# Patient Record
Sex: Female | Born: 1970 | Race: White | Hispanic: No | Marital: Married | Smoking: Former smoker
Health system: Southern US, Community
[De-identification: ages and names within clinical notes are randomized; demographics above are authoritative.]

## PROBLEM LIST (undated history)

## (undated) DIAGNOSIS — E669 Obesity, unspecified: Secondary | ICD-10-CM

## (undated) DIAGNOSIS — F32A Depression, unspecified: Secondary | ICD-10-CM

## (undated) DIAGNOSIS — I10 Essential (primary) hypertension: Secondary | ICD-10-CM

## (undated) DIAGNOSIS — F411 Generalized anxiety disorder: Secondary | ICD-10-CM

## (undated) DIAGNOSIS — J45909 Unspecified asthma, uncomplicated: Secondary | ICD-10-CM

## (undated) DIAGNOSIS — F329 Major depressive disorder, single episode, unspecified: Secondary | ICD-10-CM

## (undated) HISTORY — DX: Generalized anxiety disorder: F41.1

## (undated) HISTORY — PX: HEMORRHOID SURGERY: SHX153

## (undated) HISTORY — DX: Essential (primary) hypertension: I10

## (undated) HISTORY — DX: Obesity, unspecified: E66.9

## (undated) HISTORY — DX: Depression, unspecified: F32.A

## (undated) HISTORY — DX: Major depressive disorder, single episode, unspecified: F32.9

## (undated) HISTORY — DX: Unspecified asthma, uncomplicated: J45.909

---

## 2017-09-19 DIAGNOSIS — N390 Urinary tract infection, site not specified: Secondary | ICD-10-CM | POA: Insufficient documentation

## 2017-09-19 DIAGNOSIS — F32A Depression, unspecified: Secondary | ICD-10-CM | POA: Insufficient documentation

## 2017-09-19 DIAGNOSIS — J45909 Unspecified asthma, uncomplicated: Secondary | ICD-10-CM | POA: Insufficient documentation

## 2018-02-14 DIAGNOSIS — Z720 Tobacco use: Secondary | ICD-10-CM | POA: Insufficient documentation

## 2018-03-05 ENCOUNTER — Other Ambulatory Visit: Payer: Self-pay | Admitting: Internal Medicine

## 2018-03-05 DIAGNOSIS — Z1231 Encounter for screening mammogram for malignant neoplasm of breast: Secondary | ICD-10-CM

## 2018-03-28 ENCOUNTER — Ambulatory Visit: Payer: Self-pay

## 2018-04-25 ENCOUNTER — Ambulatory Visit
Admission: RE | Admit: 2018-04-25 | Discharge: 2018-04-25 | Disposition: A | Discharge status: Home / Self Care | Payer: Managed Care, Other (non HMO) | Source: Ambulatory Visit | Attending: Internal Medicine | Admitting: Internal Medicine

## 2018-04-25 ENCOUNTER — Other Ambulatory Visit: Payer: Self-pay | Admitting: Nurse Practitioner

## 2018-04-25 ENCOUNTER — Ambulatory Visit (HOSPITAL_COMMUNITY)
Admission: RE | Admit: 2018-04-25 | Discharge: 2018-04-25 | Disposition: A | Discharge status: Home / Self Care | Payer: Managed Care, Other (non HMO) | Source: Ambulatory Visit | Attending: Nurse Practitioner | Admitting: Nurse Practitioner

## 2018-04-25 DIAGNOSIS — R05 Cough: Secondary | ICD-10-CM | POA: Diagnosis not present

## 2018-04-25 DIAGNOSIS — R059 Cough, unspecified: Secondary | ICD-10-CM

## 2018-04-25 DIAGNOSIS — Z1231 Encounter for screening mammogram for malignant neoplasm of breast: Secondary | ICD-10-CM

## 2018-08-01 DIAGNOSIS — R000 Tachycardia, unspecified: Secondary | ICD-10-CM | POA: Insufficient documentation

## 2018-08-07 ENCOUNTER — Other Ambulatory Visit: Payer: Self-pay | Admitting: Adult Health

## 2018-08-07 MED ORDER — PROMETHAZINE-DM 6.25-15 MG/5ML PO SYRP: 5.0000 mL | ORAL_SOLUTION | Freq: Four times a day (QID) | ORAL | 1 refills | Status: DC | PRN

## 2018-08-07 NOTE — Progress Notes (Signed)
Patient is coughing at work, + nasal congestion, ongoing for 1+ week. No fever/chills, sore throat, dyspnea/wheezing, headache, rash or other concerning symptoms. Will send in promethazine-DM, follow up with PCP if not improving.

## 2018-09-03 ENCOUNTER — Other Ambulatory Visit: Payer: Self-pay | Admitting: Adult Health

## 2018-09-03 MED ORDER — MELOXICAM 15 MG PO TABS: ORAL_TABLET | ORAL | 1 refills | Status: DC

## 2018-09-25 ENCOUNTER — Other Ambulatory Visit: Payer: Self-pay | Admitting: Internal Medicine

## 2018-09-25 DIAGNOSIS — H109 Unspecified conjunctivitis: Secondary | ICD-10-CM

## 2018-09-25 MED ORDER — NEOMYCIN-POLYMYXIN-DEXAMETH 3.5-10000-0.1 OP SUSP: OPHTHALMIC | 1 refills | Status: DC

## 2018-12-18 ENCOUNTER — Other Ambulatory Visit: Payer: Self-pay | Admitting: Adult Health

## 2018-12-18 MED ORDER — OSELTAMIVIR PHOSPHATE 75 MG PO CAPS: 75.0000 mg | ORAL_CAPSULE | Freq: Two times a day (BID) | ORAL | 0 refills | Status: AC

## 2018-12-18 MED ORDER — PREDNISONE 20 MG PO TABS: ORAL_TABLET | ORAL | 0 refills | Status: DC

## 2019-02-24 ENCOUNTER — Other Ambulatory Visit: Payer: Self-pay | Admitting: Adult Health Nurse Practitioner

## 2019-02-24 DIAGNOSIS — M62838 Other muscle spasm: Secondary | ICD-10-CM

## 2019-02-24 MED ORDER — BACLOFEN 10 MG PO TABS: 10.0000 mg | ORAL_TABLET | Freq: Two times a day (BID) | ORAL | 1 refills | Status: DC

## 2019-03-18 ENCOUNTER — Other Ambulatory Visit: Payer: Self-pay | Admitting: Adult Health Nurse Practitioner

## 2019-03-18 DIAGNOSIS — M62838 Other muscle spasm: Secondary | ICD-10-CM

## 2019-04-27 ENCOUNTER — Other Ambulatory Visit: Payer: Self-pay | Admitting: Internal Medicine

## 2019-04-27 ENCOUNTER — Other Ambulatory Visit: Payer: Self-pay

## 2019-04-27 ENCOUNTER — Other Ambulatory Visit: Payer: Managed Care, Other (non HMO)

## 2019-04-27 DIAGNOSIS — Z20822 Contact with and (suspected) exposure to covid-19: Secondary | ICD-10-CM

## 2019-04-27 DIAGNOSIS — Z20828 Contact with and (suspected) exposure to other viral communicable diseases: Secondary | ICD-10-CM

## 2019-04-28 ENCOUNTER — Other Ambulatory Visit: Payer: Self-pay | Admitting: Adult Health

## 2019-04-28 DIAGNOSIS — H60502 Unspecified acute noninfective otitis externa, left ear: Secondary | ICD-10-CM

## 2019-04-28 MED ORDER — CIPROFLOXACIN-DEXAMETHASONE 0.3-0.1 % OT SUSP: 4.0000 [drp] | Freq: Two times a day (BID) | OTIC | 0 refills | Status: DC

## 2019-04-28 NOTE — Progress Notes (Signed)
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 No palpable lymph nodes on exam, canal appears erythematous, with serous discharge, very tender, non-obstructive. Tympanic membrane appears normal without erythema, retractions, bulging. Ciprodex sent in to pharmacy on file.

## 2019-04-29 ENCOUNTER — Other Ambulatory Visit: Payer: Self-pay | Admitting: Adult Health

## 2019-04-29 LAB — SARS-COV-2 RNA,(COVID-19) QUALITATIVE NAAT: SARS CoV2 RNA: NOT DETECTED

## 2019-04-29 MED ORDER — HYDROCORTISONE-ACETIC ACID 1-2 % OT SOLN: 3.0000 [drp] | Freq: Two times a day (BID) | OTIC | 1 refills | Status: DC

## 2019-06-09 ENCOUNTER — Other Ambulatory Visit: Payer: Self-pay | Admitting: Nurse Practitioner

## 2019-06-09 DIAGNOSIS — Z1231 Encounter for screening mammogram for malignant neoplasm of breast: Secondary | ICD-10-CM

## 2019-06-17 ENCOUNTER — Other Ambulatory Visit: Payer: Self-pay | Admitting: Physician Assistant

## 2019-06-17 MED ORDER — MUPIROCIN CALCIUM 2 % EX CREA: 1.0000 "application " | TOPICAL_CREAM | Freq: Three times a day (TID) | CUTANEOUS | 2 refills | Status: DC

## 2019-06-18 ENCOUNTER — Other Ambulatory Visit: Payer: Self-pay | Admitting: Physician Assistant

## 2019-06-18 MED ORDER — MUPIROCIN 2 % EX OINT: 1.0000 "application " | TOPICAL_OINTMENT | Freq: Two times a day (BID) | CUTANEOUS | 0 refills | Status: DC

## 2019-07-24 ENCOUNTER — Ambulatory Visit
Admission: RE | Admit: 2019-07-24 | Discharge: 2019-07-24 | Disposition: A | Discharge status: Home / Self Care | Payer: Managed Care, Other (non HMO) | Source: Ambulatory Visit | Attending: Nurse Practitioner | Admitting: Nurse Practitioner

## 2019-07-24 ENCOUNTER — Other Ambulatory Visit: Payer: Self-pay

## 2019-07-24 DIAGNOSIS — Z1231 Encounter for screening mammogram for malignant neoplasm of breast: Secondary | ICD-10-CM

## 2019-10-29 ENCOUNTER — Other Ambulatory Visit: Payer: Self-pay | Admitting: Nurse Practitioner

## 2019-10-29 DIAGNOSIS — N63 Unspecified lump in unspecified breast: Secondary | ICD-10-CM

## 2019-11-17 ENCOUNTER — Ambulatory Visit
Admission: RE | Admit: 2019-11-17 | Discharge: 2019-11-17 | Disposition: A | Discharge status: Home / Self Care | Payer: Managed Care, Other (non HMO) | Source: Ambulatory Visit | Attending: Nurse Practitioner | Admitting: Nurse Practitioner

## 2019-11-17 ENCOUNTER — Other Ambulatory Visit: Payer: Self-pay

## 2019-11-17 DIAGNOSIS — N63 Unspecified lump in unspecified breast: Secondary | ICD-10-CM

## 2019-11-30 ENCOUNTER — Ambulatory Visit: Payer: Managed Care, Other (non HMO) | Attending: Internal Medicine

## 2019-11-30 DIAGNOSIS — Z20822 Contact with and (suspected) exposure to covid-19: Secondary | ICD-10-CM

## 2019-12-01 LAB — NOVEL CORONAVIRUS, NAA: SARS-CoV-2, NAA: NOT DETECTED

## 2019-12-08 ENCOUNTER — Other Ambulatory Visit: Payer: Self-pay | Admitting: Adult Health Nurse Practitioner

## 2019-12-08 ENCOUNTER — Other Ambulatory Visit: Payer: Managed Care, Other (non HMO)

## 2019-12-08 ENCOUNTER — Encounter: Payer: Self-pay | Admitting: Adult Health Nurse Practitioner

## 2019-12-08 ENCOUNTER — Other Ambulatory Visit: Payer: Self-pay

## 2019-12-08 DIAGNOSIS — M79661 Pain in right lower leg: Secondary | ICD-10-CM

## 2019-12-08 NOTE — Progress Notes (Signed)
Pain to right calf, constant, increased with walking, tender to palpation.  No erythema, or edema to leg upper or lower. No shortness of breath or chest pain. - Wells screening (1) no DVT history or surgeries.  Check D-dimer, follow up Ultrasound if positive.  Elder Negus, NP [facility] 12/08/2019  2:04 PM

## 2019-12-09 LAB — D-DIMER, QUANTITATIVE: D-Dimer, Quant: 0.28 mcg/mL FEU (ref <0.50)

## 2020-03-18 ENCOUNTER — Other Ambulatory Visit: Payer: Self-pay | Admitting: Adult Health

## 2020-03-19 ENCOUNTER — Other Ambulatory Visit: Payer: Self-pay | Admitting: Adult Health

## 2020-03-19 DIAGNOSIS — U071 COVID-19: Secondary | ICD-10-CM

## 2020-03-19 MED ORDER — DEXAMETHASONE 1 MG PO TABS: ORAL_TABLET | ORAL | 0 refills | Status: DC

## 2020-04-04 ENCOUNTER — Other Ambulatory Visit: Payer: Self-pay | Admitting: Adult Health Nurse Practitioner

## 2020-04-04 DIAGNOSIS — B370 Candidal stomatitis: Secondary | ICD-10-CM

## 2020-04-04 MED ORDER — FLUCONAZOLE 150 MG PO TABS: ORAL_TABLET | ORAL | 0 refills | Status: DC

## 2020-04-15 ENCOUNTER — Other Ambulatory Visit: Payer: Self-pay | Admitting: Adult Health

## 2020-04-15 MED ORDER — ACYCLOVIR 400 MG PO TABS: 400.0000 mg | ORAL_TABLET | Freq: Three times a day (TID) | ORAL | 0 refills | Status: DC

## 2020-05-09 ENCOUNTER — Other Ambulatory Visit: Payer: Self-pay | Admitting: Adult Health Nurse Practitioner

## 2020-05-09 DIAGNOSIS — B001 Herpesviral vesicular dermatitis: Secondary | ICD-10-CM

## 2020-05-09 MED ORDER — ACYCLOVIR 400 MG PO TABS: 400.0000 mg | ORAL_TABLET | Freq: Three times a day (TID) | ORAL | 2 refills | Status: AC

## 2020-06-06 ENCOUNTER — Telehealth (INDEPENDENT_AMBULATORY_CARE_PROVIDER_SITE_OTHER): Payer: Managed Care, Other (non HMO) | Admitting: Physician Assistant

## 2020-06-06 DIAGNOSIS — Z6835 Body mass index (BMI) 35.0-35.9, adult: Secondary | ICD-10-CM

## 2020-06-06 MED ORDER — WEGOVY 0.25 MG/0.5ML ~~LOC~~ SOAJ: 0.2500 mg | SUBCUTANEOUS | 0 refills | Status: DC

## 2020-06-06 MED ORDER — WEGOVY 0.5 MG/0.5ML ~~LOC~~ SOAJ: 0.5000 mg | SUBCUTANEOUS | 0 refills | Status: DC

## 2020-06-06 NOTE — Telephone Encounter (Signed)
Patient's BMI is 35.5 with weight os 188.6 this AM  presents for [facility] initiation. She has tried several medications for weight loss including phentermine, saxenda, weight watchers, exercise without success.   has COVID-19 on their problem list.  Will send in [facility] for the patient to start, will do 2-3 month follow up and will contact the office with any issues.

## 2020-07-07 ENCOUNTER — Other Ambulatory Visit: Payer: Self-pay | Admitting: Adult Health Nurse Practitioner

## 2020-07-07 DIAGNOSIS — Z20822 Contact with and (suspected) exposure to covid-19: Secondary | ICD-10-CM

## 2020-07-08 LAB — SARS-COV-2 RNA,(COVID-19) QUALITATIVE NAAT: SARS CoV2 RNA: NOT DETECTED

## 2020-07-13 ENCOUNTER — Other Ambulatory Visit: Payer: Self-pay | Admitting: Physician Assistant

## 2020-07-13 MED ORDER — WEGOVY 1 MG/0.5ML ~~LOC~~ SOAJ: 1.0000 mg | SUBCUTANEOUS | 0 refills | Status: DC

## 2020-07-13 MED ORDER — WEGOVY 1.7 MG/0.75ML ~~LOC~~ SOAJ: 1.7000 mg | SUBCUTANEOUS | 0 refills | Status: DC

## 2020-08-09 ENCOUNTER — Other Ambulatory Visit: Payer: Self-pay | Admitting: Physician Assistant

## 2020-08-09 DIAGNOSIS — Z6835 Body mass index (BMI) 35.0-35.9, adult: Secondary | ICD-10-CM

## 2020-08-09 MED ORDER — SEMAGLUTIDE-WEIGHT MANAGEMENT 2.4 MG/0.75ML ~~LOC~~ SOAJ: 2.4000 mg | SUBCUTANEOUS | 2 refills | Status: DC

## 2020-09-30 ENCOUNTER — Ambulatory Visit (INDEPENDENT_AMBULATORY_CARE_PROVIDER_SITE_OTHER): Payer: Managed Care, Other (non HMO) | Admitting: Podiatry

## 2020-09-30 ENCOUNTER — Ambulatory Visit (INDEPENDENT_AMBULATORY_CARE_PROVIDER_SITE_OTHER): Payer: Managed Care, Other (non HMO)

## 2020-09-30 ENCOUNTER — Other Ambulatory Visit: Payer: Self-pay

## 2020-09-30 DIAGNOSIS — M79671 Pain in right foot: Secondary | ICD-10-CM

## 2020-09-30 DIAGNOSIS — M778 Other enthesopathies, not elsewhere classified: Secondary | ICD-10-CM

## 2020-09-30 MED ORDER — MELOXICAM 15 MG PO TABS: 15.0000 mg | ORAL_TABLET | Freq: Every day | ORAL | 3 refills | Status: DC

## 2020-10-03 ENCOUNTER — Encounter: Payer: Self-pay | Admitting: Podiatry

## 2020-10-03 NOTE — Progress Notes (Signed)
  Subjective:  Patient ID: Brianna Collins, female    DOB: 1970-11-24,  MRN: [id number]  Chief Complaint  Patient presents with  . Foot Pain    Right foot lateral aspect foot pain 3 week duration no known injuries    49 y.o. female presents with the above complaint. History confirmed with patient.  She has history of plantar fasciitis in this foot previously treated  Objective:  Physical Exam: warm, good capillary refill, no trophic changes or ulcerative lesions, normal DP and PT pulses and normal sensory exam.  Right Foot: She has pain with resisted extension of the digits and along the peroneus tertius and over the EDB    Radiographs: X-ray of the right foot: no fracture, dislocation, swelling or degenerative changes noted Assessment:   1. Pain in right foot   2. Extensor tendinitis of foot      Plan:  Patient was evaluated and treated and all questions answered.  Patient has extensor tendinitis of the long extensors, possibly the extensor digitorum brevis muscle belly with the peroneus tertius.  Unclear if this is overcompensation.  I recommend we give her support and immobilize it is to boot.  I reviewed RICE protocol with her.  I prescribed her meloxicam for anti-inflammatory effect.  No follow-ups on file.

## 2020-10-04 ENCOUNTER — Other Ambulatory Visit: Payer: Self-pay | Admitting: Podiatry

## 2020-10-04 DIAGNOSIS — M778 Other enthesopathies, not elsewhere classified: Secondary | ICD-10-CM

## 2020-10-10 ENCOUNTER — Other Ambulatory Visit: Payer: Self-pay | Admitting: Adult Health

## 2020-10-10 DIAGNOSIS — Z6835 Body mass index (BMI) 35.0-35.9, adult: Secondary | ICD-10-CM

## 2020-10-10 MED ORDER — SEMAGLUTIDE-WEIGHT MANAGEMENT 2.4 MG/0.75ML ~~LOC~~ SOAJ: 2.4000 mg | SUBCUTANEOUS | 2 refills | Status: DC

## 2020-10-28 ENCOUNTER — Ambulatory Visit: Payer: Managed Care, Other (non HMO) | Admitting: Podiatry

## 2020-11-23 ENCOUNTER — Other Ambulatory Visit: Payer: Self-pay

## 2020-11-23 DIAGNOSIS — Z6835 Body mass index (BMI) 35.0-35.9, adult: Secondary | ICD-10-CM

## 2020-11-23 MED ORDER — SEMAGLUTIDE-WEIGHT MANAGEMENT 2.4 MG/0.75ML ~~LOC~~ SOAJ: 2.4000 mg | SUBCUTANEOUS | 2 refills | Status: DC

## 2020-11-28 ENCOUNTER — Other Ambulatory Visit: Payer: Self-pay | Admitting: Adult Health

## 2020-11-28 MED ORDER — ONDANSETRON HCL 4 MG PO TABS: 4.0000 mg | ORAL_TABLET | Freq: Three times a day (TID) | ORAL | 0 refills | Status: DC | PRN

## 2021-01-05 ENCOUNTER — Other Ambulatory Visit: Payer: Self-pay | Admitting: Nurse Practitioner

## 2021-01-05 DIAGNOSIS — Z1231 Encounter for screening mammogram for malignant neoplasm of breast: Secondary | ICD-10-CM

## 2021-01-14 ENCOUNTER — Ambulatory Visit
Admission: RE | Admit: 2021-01-14 | Discharge: 2021-01-14 | Disposition: A | Discharge status: Home / Self Care | Payer: 59 | Source: Ambulatory Visit | Attending: Nurse Practitioner | Admitting: Nurse Practitioner

## 2021-01-14 ENCOUNTER — Other Ambulatory Visit: Payer: Self-pay

## 2021-01-14 DIAGNOSIS — Z1231 Encounter for screening mammogram for malignant neoplasm of breast: Secondary | ICD-10-CM

## 2021-01-18 ENCOUNTER — Other Ambulatory Visit: Payer: Self-pay | Admitting: Nurse Practitioner

## 2021-01-18 DIAGNOSIS — R928 Other abnormal and inconclusive findings on diagnostic imaging of breast: Secondary | ICD-10-CM

## 2021-02-03 ENCOUNTER — Ambulatory Visit
Admission: RE | Admit: 2021-02-03 | Discharge: 2021-02-03 | Disposition: A | Discharge status: Home / Self Care | Payer: 59 | Source: Ambulatory Visit | Attending: Nurse Practitioner | Admitting: Nurse Practitioner

## 2021-02-03 ENCOUNTER — Other Ambulatory Visit: Payer: Self-pay

## 2021-02-03 DIAGNOSIS — R928 Other abnormal and inconclusive findings on diagnostic imaging of breast: Secondary | ICD-10-CM

## 2021-03-15 ENCOUNTER — Other Ambulatory Visit: Payer: Self-pay | Admitting: *Deleted

## 2021-03-15 DIAGNOSIS — Z6835 Body mass index (BMI) 35.0-35.9, adult: Secondary | ICD-10-CM

## 2021-03-15 MED ORDER — SEMAGLUTIDE-WEIGHT MANAGEMENT 2.4 MG/0.75ML ~~LOC~~ SOAJ: 2.4000 mg | SUBCUTANEOUS | 2 refills | Status: DC

## 2021-05-12 ENCOUNTER — Other Ambulatory Visit: Payer: Self-pay | Admitting: Adult Health

## 2021-05-12 DIAGNOSIS — Z6835 Body mass index (BMI) 35.0-35.9, adult: Secondary | ICD-10-CM

## 2021-05-12 MED ORDER — SEMAGLUTIDE-WEIGHT MANAGEMENT 2.4 MG/0.75ML ~~LOC~~ SOAJ: 2.4000 mg | SUBCUTANEOUS | 2 refills | Status: DC

## 2021-07-10 ENCOUNTER — Other Ambulatory Visit: Payer: Self-pay | Admitting: Adult Health

## 2021-07-10 DIAGNOSIS — Z6835 Body mass index (BMI) 35.0-35.9, adult: Secondary | ICD-10-CM

## 2021-07-14 ENCOUNTER — Encounter: Payer: Self-pay | Admitting: Internal Medicine

## 2021-07-14 ENCOUNTER — Ambulatory Visit (INDEPENDENT_AMBULATORY_CARE_PROVIDER_SITE_OTHER): Payer: 59 | Admitting: Internal Medicine

## 2021-07-14 VITALS — BP 108/68 | HR 106 | Ht 61.5 in | Wt 134.0 lb

## 2021-07-14 DIAGNOSIS — K2100 Gastro-esophageal reflux disease with esophagitis, without bleeding: Secondary | ICD-10-CM | POA: Diagnosis not present

## 2021-07-14 DIAGNOSIS — Z800 Family history of malignant neoplasm of digestive organs: Secondary | ICD-10-CM | POA: Diagnosis not present

## 2021-07-14 DIAGNOSIS — Z1211 Encounter for screening for malignant neoplasm of colon: Secondary | ICD-10-CM

## 2021-07-14 DIAGNOSIS — K649 Unspecified hemorrhoids: Secondary | ICD-10-CM

## 2021-07-14 NOTE — Patient Instructions (Signed)
If you are age 49 or older, your body mass index should be between 23-30. Your Body mass index is 24.91 kg/m. If this is out of the aforementioned range listed, please consider follow up with your Primary Care Provider.  If you are age 80 or younger, your body mass index should be between 19-25. Your Body mass index is 24.91 kg/m. If this is out of the aformentioned range listed, please consider follow up with your Primary Care Provider.   __________________________________________________________  The [facility] providers would like to encourage you to use [facility] to communicate with providers for non-urgent requests or questions.  Due to long hold times on the telephone, sending your provider a message by [facility] may be a faster and more efficient way to get a response.  Please allow 48 business hours for a response.  Please remember that this is for non-urgent requests.   You have been scheduled for an endoscopy and colonoscopy. Please follow the written instructions given to you at your visit today. Please pick up your prep supplies at the pharmacy within the next 1-3 days. If you use inhalers (even only as needed), please bring them with you on the day of your procedure.   I appreciate the opportunity to care for you. Stan Head, MD, [facility]

## 2021-07-14 NOTE — Progress Notes (Signed)
Brianna Collins 50 y.o. [birth year] [id number]  Assessment & Plan:   Encounter Diagnoses  Name Primary?   Gastroesophageal reflux disease with esophagitis without hemorrhage Yes   Family history of esophageal cancer    Colon cancer screening    Hemorrhoids, unspecified hemorrhoid type     Schedule upper GI endoscopy due to GERD and family history of esophageal cancer in the setting of Barrett's esophagus.  Schedule screening colonoscopy.  The risks and benefits as well as alternatives of endoscopic procedure(s) have been discussed and reviewed. All questions answered. The patient agrees to proceed.   Further evaluation of hemorrhoids and colonoscopy and further treatment recommendations then.  She understands that 2 shots of vodka daily is more than what is recommended in her situation.  I appreciate the opportunity to care for this patient. 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  She has reflux symptoms that have been controlled by famotidine, the started after the initiation of Wegovy injection therapy for weight loss which has been quite successful.  She started on 40 mg of famotidine and then went to 20 mg and she is asymptomatic with reflux.  No dysphagia.  Her father had esophageal cancer in the setting of Barrett's esophagus and she is requesting a screening endoscopy.  No dysphagia no unintentional weight loss.  Some constipation with the weight loss medication.  She has a long history of hemorrhoids status post hemorrhoidectomy in her 30s, they began to be a problem during or after childbirth, thought she will have intermittent swelling and bleeding and irritation.  She does drink  12 ounces of coffee a day and 2 shots of vodka daily.  Wt Readings from Last 3 Encounters:  07/14/21 134 lb (60.8 kg)  Self-reports she was 188 pounds 11 months ago when she started the wegovy  No Known Allergies Current Meds  Medication Sig   aspirin 81 MG EC tablet Take by mouth.   buPROPion (WELLBUTRIN XL) 300 MG 24 hr tablet Take 300 mg by mouth at bedtime.   Docusate Calcium (STOOL SOFTENER PO) Take 1 capsule by mouth as needed.   famotidine (PEPCID) 20 MG tablet Take 20 mg by mouth 2 (two) times daily.   fexofenadine (ALLEGRA) 180 MG tablet Take by mouth.   FLUoxetine (PROZAC) 40 MG capsule Take 40 mg by mouth at bedtime.   Magnesium 500 MG CAPS Take by mouth.   nitrofurantoin, macrocrystal-monohydrate, (MACROBID) 100 MG capsule Take by mouth.   ondansetron (ZOFRAN) 4 MG tablet Take 1 tablet (4 mg total) by mouth every 8 (eight) hours as needed for nausea or vomiting.   WEGOVY 2.4 MG/0.75ML SOAJ INJECT 2.4 MG INTO THE SKIN ONCE A WEEK.   Past Medical History:  Diagnosis Date   Asthma    Depressive disorder    GAD (generalized anxiety disorder)    Hypertension    Obesity    Past Surgical History:  Procedure Laterality Date   HEMORRHOID SURGERY     Social History   Social History Narrative   Patient is married, 1 son and 1 daughter      She is a CMA at [employer]   Former smoker no current tobacco she drinks  12 ounces of coffee a day 2 alcoholic beverages (vodka) a day.  No drug use.   family history includes Barrett's esophagus in her father; Esophageal cancer in her father; Renal cancer in her paternal grandfather; Stroke in her maternal aunt.   Review of Systems As per HPI some allergy problems anxiety issues back pain joint pains intermittent epistaxis reported all other review of systems are negative  Objective:   Physical Exam  BP 108/68   Pulse (!) 106   Ht 5' 1.5" (1.562 m)   Wt 134 lb (60.8 kg)   BMI 24.91 kg/m   Well-developed well-nourished white woman in no acute distress Eyes are anicteric The lungs are clear The heart sounds are normal S1-S2 no rubs murmurs or gallops The abdomen is soft and nontender without organomegaly or mass Bowel sounds are present Rectal exam is deferred until colonoscopy  She is alert and oriented x3 and has an appropriate mood and affect

## 2021-07-14 NOTE — Addendum Note (Signed)
Addended by: Iva Boop on: 07/14/2021 02:43 PM   Modules accepted: Orders

## 2021-08-18 ENCOUNTER — Encounter: Payer: Self-pay | Admitting: Internal Medicine

## 2021-08-25 ENCOUNTER — Encounter: Payer: Self-pay | Admitting: Internal Medicine

## 2021-08-25 ENCOUNTER — Ambulatory Visit (AMBULATORY_SURGERY_CENTER): Payer: 59 | Admitting: Internal Medicine

## 2021-08-25 ENCOUNTER — Other Ambulatory Visit: Payer: Self-pay

## 2021-08-25 VITALS — BP 152/98 | HR 90 | Temp 98.4°F | Resp 17 | Ht 61.5 in | Wt 134.0 lb

## 2021-08-25 DIAGNOSIS — R12 Heartburn: Secondary | ICD-10-CM | POA: Diagnosis not present

## 2021-08-25 DIAGNOSIS — K2970 Gastritis, unspecified, without bleeding: Secondary | ICD-10-CM | POA: Diagnosis not present

## 2021-08-25 DIAGNOSIS — K3189 Other diseases of stomach and duodenum: Secondary | ICD-10-CM | POA: Diagnosis not present

## 2021-08-25 DIAGNOSIS — Z1211 Encounter for screening for malignant neoplasm of colon: Secondary | ICD-10-CM | POA: Diagnosis not present

## 2021-08-25 DIAGNOSIS — K2100 Gastro-esophageal reflux disease with esophagitis, without bleeding: Secondary | ICD-10-CM

## 2021-08-25 MED ORDER — SODIUM CHLORIDE 0.9 % IV SOLN: 500.0000 mL | Freq: Once | INTRAVENOUS | Status: DC

## 2021-08-25 NOTE — Patient Instructions (Addendum)
No Barrett's or reflux changes seen. You do have a very small hiatal hernia. I also saw some gastritis which I checked to see if it is caused by H pylori (biopsies)  Colonoscopy was normal.  I appreciate the opportunity to care for you. Iva Boop, MD, FACG    YOU HAD AN ENDOSCOPIC PROCEDURE TODAY AT THE [facility]:   Refer to the procedure report that was given to you for any specific questions about what was found during the examination.  If the procedure report does not answer your questions, please call your gastroenterologist to clarify.  If you requested that your care partner not be given the details of your procedure findings, then the procedure report has been included in a sealed envelope for you to review at your convenience later.  YOU SHOULD EXPECT: Some feelings of bloating in the abdomen. Passage of more gas than usual.  Walking can help get rid of the air that was put into your GI tract during the procedure and reduce the bloating. If you had a lower endoscopy (such as a colonoscopy or flexible sigmoidoscopy) you may notice spotting of blood in your stool or on the toilet paper. If you underwent a bowel prep for your procedure, you may not have a normal bowel movement for a few days.  Please Note:  You might notice some irritation and congestion in your nose or some drainage.  This is from the oxygen used during your procedure.  There is no need for concern and it should clear up in a day or so.  SYMPTOMS TO REPORT IMMEDIATELY:  Following lower endoscopy (colonoscopy or flexible sigmoidoscopy):  Excessive amounts of blood in the stool  Significant tenderness or worsening of abdominal pains  Swelling of the abdomen that is new, acute  Fever of 100F or higher  Following upper endoscopy (EGD)  Vomiting of blood or coffee ground material  New chest pain or pain under the shoulder blades  Painful or persistently difficult swallowing  New shortness of  breath  Fever of 100F or higher  Black, tarry-looking stools  For urgent or emergent issues, a gastroenterologist can be reached at any hour by calling [phone]. Do not use MyChart messaging for urgent concerns.    DIET:  We do recommend a small meal at first, but then you may proceed to your regular diet.  Drink plenty of fluids but you should avoid alcoholic beverages for 24 hours.  ACTIVITY:  You should plan to take it easy for the rest of today and you should NOT DRIVE or use heavy machinery until tomorrow (because of the sedation medicines used during the test).    FOLLOW UP: Our staff will call the number listed on your records 48-72 hours following your procedure to check on you and address any questions or concerns that you may have regarding the information given to you following your procedure. If we do not reach you, we will leave a message.  We will attempt to reach you two times.  During this call, we will ask if you have developed any symptoms of COVID 19. If you develop any symptoms (ie: fever, flu-like symptoms, shortness of breath, cough etc.) before then, please call [phone].  If you test positive for Covid 19 in the 2 weeks post procedure, please call and report this information to [place].    If any biopsies were taken you will be contacted by phone or by letter within the next 1-3 weeks.  Please call us at [phone] if you have not heard about the biopsies in 3 weeks.    SIGNATURES/CONFIDENTIALITY: You and/or your care partner have signed paperwork which will be entered into your electronic medical record.  These signatures attest to the fact that that the information above on your After Visit Summary has been reviewed and is understood.  Full responsibility of the confidentiality of this discharge information lies with you and/or your care-partner.

## 2021-08-25 NOTE — Progress Notes (Signed)
Pt's states no medical or surgical changes since previsit or office visit. 

## 2021-08-25 NOTE — Op Note (Addendum)
[facility] Patient Name: Brianna Collins Procedure Date: 08/25/2021 3:23 PM MRN: [id number] Endoscopist: Iva Boop , MD Age: 50 Referring MD:  Date of Birth: March 03, 1971 Gender: Female Account #: [id number] Procedure:                Upper GI endoscopy Indications:              Heartburn, Suspected esophageal reflux Medicines:                Propofol per Anesthesia, Monitored Anesthesia Care Procedure:                Pre-Anesthesia Assessment:                           - Prior to the procedure, a History and Physical                            was performed, and patient medications and                            allergies were reviewed. The patient's tolerance of                            previous anesthesia was also reviewed. The risks                            and benefits of the procedure and the sedation                            options and risks were discussed with the patient.                            All questions were answered, and informed consent                            was obtained. Prior Anticoagulants: The patient has                            taken no previous anticoagulant or antiplatelet                            agents. ASA Grade Assessment: II - A patient with                            mild systemic disease. After reviewing the risks                            and benefits, the patient was deemed in                            satisfactory condition to undergo the procedure.                           After obtaining informed consent, the endoscope was  passed under direct vision. Throughout the                            procedure, the patient's blood pressure, pulse, and                            oxygen saturations were monitored continuously. The                            GIF [id number] #[id number] was introduced through the                            mouth, and advanced to the second part of duodenum.                             The upper GI endoscopy was accomplished without                            difficulty. The patient tolerated the procedure                            fairly well. Scope In: Scope Out: Findings:                 The examined esophagus was normal.                           A 2 cm hiatal hernia was present.                           Diffuse mild inflammation was found in the gastric                            antrum. Biopsies were taken with a cold forceps for                            histology. Verification of patient identification                            for the specimen was done. Estimated blood loss was                            minimal.                           The exam was otherwise without abnormality.                           The cardia and gastric fundus were normal on                            retroflexion. Complications:            No immediate complications. Estimated Blood Loss:     Estimated blood loss was minimal. Impression:               -  Normal esophagus. ? non-obstructing ring but no                            Barrett's esophagus                           - 2 cm hiatal hernia.                           - Gastritis. Biopsied.                           - The examination was otherwise normal.                           Did have transient airway spasm and coughing and                            suffered small lip laceration. Recommendation:           - Patient has a contact number available for                            emergencies. The signs and symptoms of potential                            delayed complications were discussed with the                            patient. Return to normal activities tomorrow.                            Written discharge instructions were provided to the                            patient.                           - Resume previous diet.                           - Continue present medications.                            - See the other procedure note for documentation of                            additional recommendations. Iva Boop, MD 08/25/2021 4:14:14 PM This report has been signed electronically.

## 2021-08-25 NOTE — Progress Notes (Signed)
[facility] History and Physical   Primary Care Physician:  Jettie Pagan, NP   Reason for Procedure:   GERD, colon cancer screening  Plan:    EGD, colonoscopy     HPI: Brianna Collins is a 50 y.o. female w/ GERD sxs and FHx esophageal cancer in Barrett's. She also needs colon cancer screenig.   Past Medical History:  Diagnosis Date   Asthma    Depressive disorder    GAD (generalized anxiety disorder)    Hypertension    Obesity     Past Surgical History:  Procedure Laterality Date   HEMORRHOID SURGERY      Prior to Admission medications   Medication Sig Start Date End Date Taking? Authorizing Provider  buPROPion (WELLBUTRIN XL) 300 MG 24 hr tablet Take 300 mg by mouth at bedtime. 09/05/20  Yes [provider]  D3-50 1.25 MG (50000 UT) capsule Take 50,000 Units by mouth 3 (three) times a week. 08/01/21  Yes [provider]  Docusate Calcium (STOOL SOFTENER PO) Take 1 capsule by mouth as needed.   Yes [provider]  famotidine (PEPCID) 20 MG tablet Take 20 mg by mouth 2 (two) times daily.   Yes [provider]  fexofenadine (ALLEGRA) 180 MG tablet Take by mouth.   Yes [provider]  FLUoxetine (PROZAC) 40 MG capsule Take 40 mg by mouth at bedtime. 09/04/20  Yes [provider]  verapamil (CALAN-SR) 120 MG CR tablet Take by mouth. 08/23/21  Yes [provider]  WEGOVY 2.4 MG/0.75ML SOAJ INJECT 2.4 MG INTO THE SKIN ONCE A WEEK. 07/10/21  Yes Revonda Humphrey, NP  aspirin 81 MG EC tablet Take by mouth. Patient not taking: Reported on 08/25/2021    [provider]  Magnesium 500 MG CAPS Take by mouth. Patient not taking: Reported on 08/25/2021    [provider]  nitrofurantoin, macrocrystal-monohydrate, (MACROBID) 100 MG capsule Take by mouth.    [provider]  ondansetron (ZOFRAN) 4 MG tablet Take 1 tablet (4 mg total) by mouth every 8 (eight) hours as needed for nausea  or vomiting. Patient not taking: Reported on 08/25/2021 11/28/20   Judd Gaudier, NP    Current Outpatient Medications  Medication Sig Dispense Refill   buPROPion (WELLBUTRIN XL) 300 MG 24 hr tablet Take 300 mg by mouth at bedtime.     D3-50 1.25 MG (50000 UT) capsule Take 50,000 Units by mouth 3 (three) times a week.     Docusate Calcium (STOOL SOFTENER PO) Take 1 capsule by mouth as needed.     famotidine (PEPCID) 20 MG tablet Take 20 mg by mouth 2 (two) times daily.     fexofenadine (ALLEGRA) 180 MG tablet Take by mouth.     FLUoxetine (PROZAC) 40 MG capsule Take 40 mg by mouth at bedtime.     verapamil (CALAN-SR) 120 MG CR tablet Take by mouth.     WEGOVY 2.4 MG/0.75ML SOAJ INJECT 2.4 MG INTO THE SKIN ONCE A WEEK. 3 mL 2   aspirin 81 MG EC tablet Take by mouth. (Patient not taking: Reported on 08/25/2021)     Magnesium 500 MG CAPS Take by mouth. (Patient not taking: Reported on 08/25/2021)     nitrofurantoin, macrocrystal-monohydrate, (MACROBID) 100 MG capsule Take by mouth.     ondansetron (ZOFRAN) 4 MG tablet Take 1 tablet (4 mg total) by mouth every 8 (eight) hours as needed for nausea or vomiting. (Patient not taking: Reported on 08/25/2021) 60  tablet 0   Current Facility-Administered Medications  Medication Dose Route Frequency Provider Last Rate Last Admin   0.9 %  sodium chloride infusion  500 mL Intravenous Once Iva Boop, MD        Allergies as of 08/25/2021   (No Known Allergies)    Family History  Problem Relation Age of Onset   Barrett's esophagus Father    Esophageal cancer Father    Stroke Maternal Aunt    Renal cancer Paternal Grandfather    Breast cancer Neg Hx    Colon cancer Neg Hx    Rectal cancer Neg Hx    Stomach cancer Neg Hx     Social History   Socioeconomic History   Marital status: Married                   Occupational History   CMA  Tobacco Use   Smoking status: Former    Packs/day: 0.50    Types: Cigarettes    Start date: 1990     Quit date: 2019    Years since quitting: 3.7   Smokeless tobacco: Never  Substance and Sexual Activity   Alcohol use: Yes    Comment: 2 drinks a night   Drug use: Never      Other Topics Concern   Not on file  Social History Narrative   Patient is married, 1 son and 1 daughter      She is a CMA at [employer]   Former smoker no current tobacco she drinks 12 ounces of coffee a day 2 alcoholic beverages (vodka) a day.  No drug use.    Review of Systems:  All other review of systems negative except as mentioned in the HPI.  Physical Exam: Vital signs BP 122/83   Pulse 97   Temp 98.4 F (36.9 C) (Temporal)   Ht 5' 1.5" (1.562 m)   Wt 134 lb (60.8 kg)   LMP 08/03/2021 (Approximate)   SpO2 100%   BMI 24.91 kg/m   General:   Alert,  Well-developed, well-nourished, pleasant and cooperative in NAD Lungs:  Clear throughout to auscultation.   Heart:  Regular rate and rhythm; no murmurs, clicks, rubs,  or gallops. Abdomen:  Soft, nontender and nondistended. Normal bowel sounds.   Neuro/Psych:  Alert and cooperative. Normal mood and affect. A and O x 3   @Tanaya Dunigan  , MD, [facility] [phone] (pager) 08/25/2021 3:23 PM@

## 2021-08-25 NOTE — Progress Notes (Signed)
Pt Drowsy. VSS. To PACU, report to RN. No anesthetic complications noted.  

## 2021-08-25 NOTE — Progress Notes (Signed)
PT has a hyperreactive airway with passing of upper endoscopy scope. Recommended with next EGD to have Lidocaine gargle as well as IV dose to reduce airway irritability. MG

## 2021-08-25 NOTE — Op Note (Signed)
[facility] Patient Name: Brianna Collins Procedure Date: 08/25/2021 3:22 PM MRN: [id number] Endoscopist: Iva Boop , MD Age: 50 Referring MD:  Date of Birth: [birth year] Gender: Female Account #: [id number] Procedure:                Colonoscopy Indications:              Screening for colorectal malignant neoplasm Medicines:                Propofol per Anesthesia, Monitored Anesthesia Care Procedure:                Pre-Anesthesia Assessment:                           - Prior to the procedure, a History and Physical                            was performed, and patient medications and                            allergies were reviewed. The patient's tolerance of                            previous anesthesia was also reviewed. The risks                            and benefits of the procedure and the sedation                            options and risks were discussed with the patient.                            All questions were answered, and informed consent                            was obtained. Prior Anticoagulants: The patient has                            taken no previous anticoagulant or antiplatelet                            agents. ASA Grade Assessment: II - A patient with                            mild systemic disease. After reviewing the risks                            and benefits, the patient was deemed in                            satisfactory condition to undergo the procedure.                           After obtaining informed consent, the colonoscope  was passed under direct vision. Throughout the                            procedure, the patient's blood pressure, pulse, and                            oxygen saturations were monitored continuously. The                            Olympus PCF-H190DL ([id number]) Colonoscope was                            introduced through the anus and advanced to the the                             cecum, identified by appendiceal orifice and                            ileocecal valve. The colonoscopy was performed                            without difficulty. The patient tolerated the                            procedure well. The quality of the bowel                            preparation was good. The ileocecal valve,                            appendiceal orifice, and rectum were photographed. Scope In: 3:41:34 PM Scope Out: 3:58:17 PM Scope Withdrawal Time: 0 hours 10 minutes 38 seconds  Total Procedure Duration: 0 hours 16 minutes 43 seconds  Findings:                 The perianal and digital rectal examinations were                            normal.                           The entire examined colon appeared normal on direct                            and retroflexion views. Complications:            No immediate complications. Estimated Blood Loss:     Estimated blood loss: none. Impression:               - The entire examined colon is normal on direct and                            retroflexion views.                           - No specimens collected. Recommendation:           -  Patient has a contact number available for                            emergencies. The signs and symptoms of potential                            delayed complications were discussed with the                            patient. Return to normal activities tomorrow.                            Written discharge instructions were provided to the                            patient.                           - Resume previous diet.                           - Continue present medications.                           - Repeat colonoscopy in 10 years for screening                            purposes. Iva Boop, MD 08/25/2021 4:16:20 PM This report has been signed electronically.

## 2021-08-29 ENCOUNTER — Telehealth: Payer: Self-pay

## 2021-08-29 NOTE — Telephone Encounter (Signed)
  Follow up Call-  Call back number 08/25/2021  Post procedure Call Back phone  # [phone]  Permission to leave phone message Yes  Some recent data might be hidden     Patient questions:  Do you have a fever, pain , or abdominal swelling? No. Pain Score  0 *  pt states she must have bitten her lip during procedure and it is still sore but ok.  Have you tolerated food without any problems? Yes.    Have you been able to return to your normal activities? Yes.    Do you have any questions about your discharge instructions: Diet   No. Medications  No. Follow up visit  No.  Do you have questions or concerns about your Care? No.  Actions: * If pain score is 4 or above: No action needed, pain <4.

## 2021-09-05 ENCOUNTER — Encounter: Payer: Self-pay | Admitting: Internal Medicine

## 2021-09-12 ENCOUNTER — Other Ambulatory Visit: Payer: Self-pay | Admitting: Adult Health

## 2021-09-12 ENCOUNTER — Ambulatory Visit (INDEPENDENT_AMBULATORY_CARE_PROVIDER_SITE_OTHER): Payer: 59

## 2021-09-12 ENCOUNTER — Other Ambulatory Visit: Payer: Self-pay

## 2021-09-12 DIAGNOSIS — R002 Palpitations: Secondary | ICD-10-CM

## 2021-09-12 NOTE — Progress Notes (Signed)
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 Fib, returned to office last night with NSR, several episodes ? Frequent PACs vs a. Fib noted on apple watch. No irregular rhythm at this time. Will refer for holter.

## 2021-09-12 NOTE — Progress Notes (Unsigned)
Patient enrolled for [employer] to mail a 2-3 day ZIO XT long term holter monitor to her address on file. 

## 2021-09-15 ENCOUNTER — Encounter: Payer: Self-pay | Admitting: Nurse Practitioner

## 2021-09-20 DIAGNOSIS — R002 Palpitations: Secondary | ICD-10-CM

## 2021-09-22 ENCOUNTER — Ambulatory Visit (INDEPENDENT_AMBULATORY_CARE_PROVIDER_SITE_OTHER): Payer: 59

## 2021-09-22 ENCOUNTER — Other Ambulatory Visit: Payer: Self-pay

## 2021-09-22 VITALS — Temp 97.7°F

## 2021-09-22 DIAGNOSIS — Z23 Encounter for immunization: Secondary | ICD-10-CM | POA: Diagnosis not present

## 2021-09-22 NOTE — Progress Notes (Signed)
Patient presents today for a flu shot.  

## 2021-09-26 ENCOUNTER — Telehealth: Payer: Self-pay

## 2021-09-26 NOTE — Telephone Encounter (Signed)
[employer] prior auth approved through 03/26/22.

## 2021-10-02 ENCOUNTER — Other Ambulatory Visit: Payer: Self-pay | Admitting: Nurse Practitioner

## 2021-10-02 DIAGNOSIS — Z6835 Body mass index (BMI) 35.0-35.9, adult: Secondary | ICD-10-CM

## 2021-10-08 IMAGING — MG MM DIGITAL DIAGNOSTIC UNILAT*R* W/ TOMO W/ CAD
4 series · 4 of 12 positions shown · non-contrast
Comparison: Previous exam(s).

CLINICAL DATA: Possible mass in the central, slightly outer right
breast on a recent screening mammogram.

EXAM:
DIGITAL DIAGNOSTIC UNILATERAL RIGHT MAMMOGRAM WITH TOMOSYNTHESIS AND
CAD; ULTRASOUND RIGHT BREAST LIMITED
TECHNIQUE: Right digital diagnostic mammography and breast tomosynthesis was
performed. The images were evaluated with computer-aided detection.;
Targeted ultrasound examination of the right breast was performed

[R MLO synth-2D]
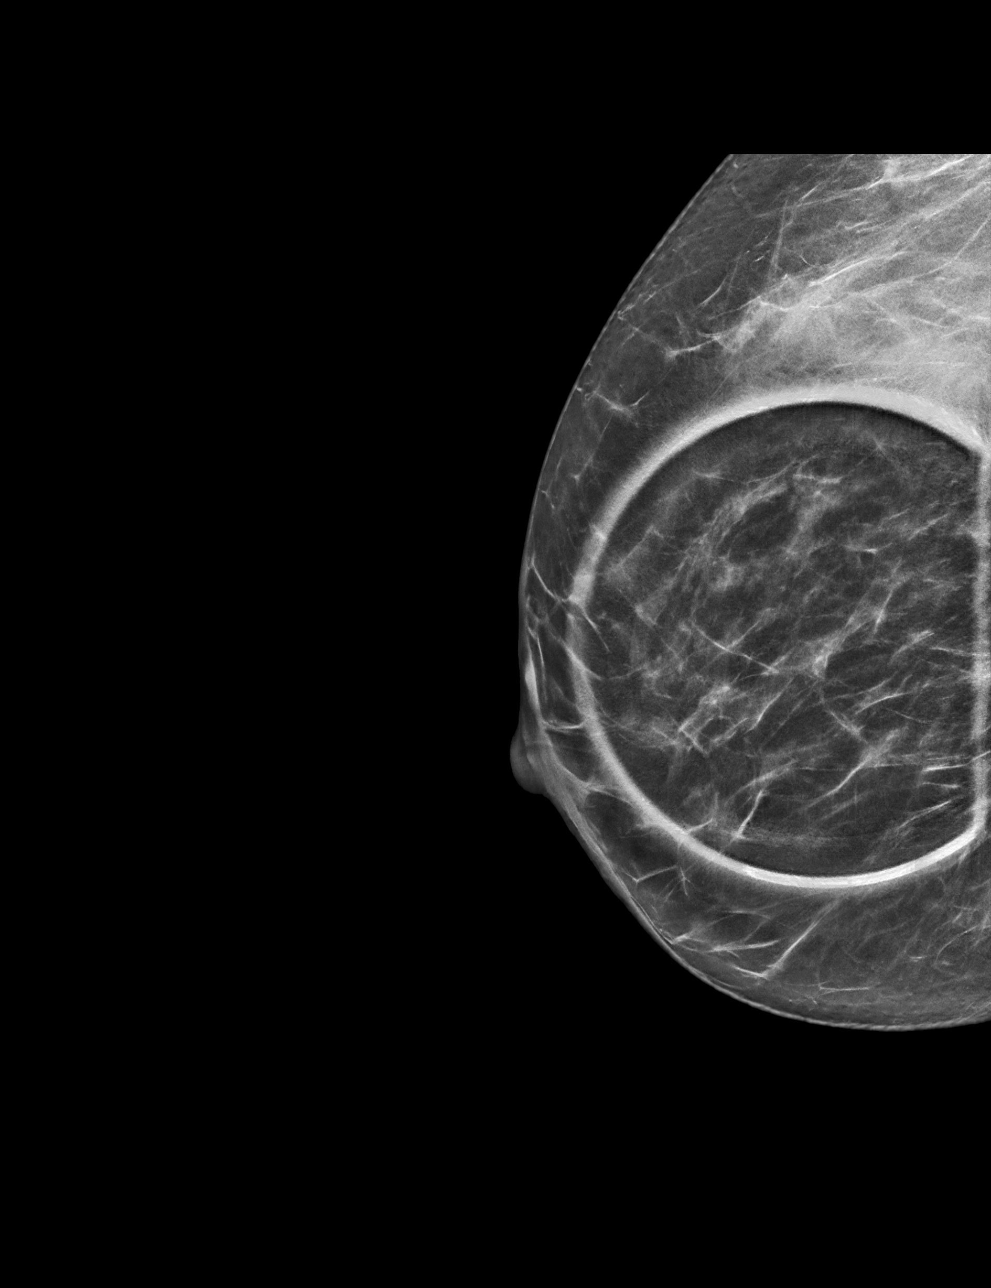

[R CC synth-2D]
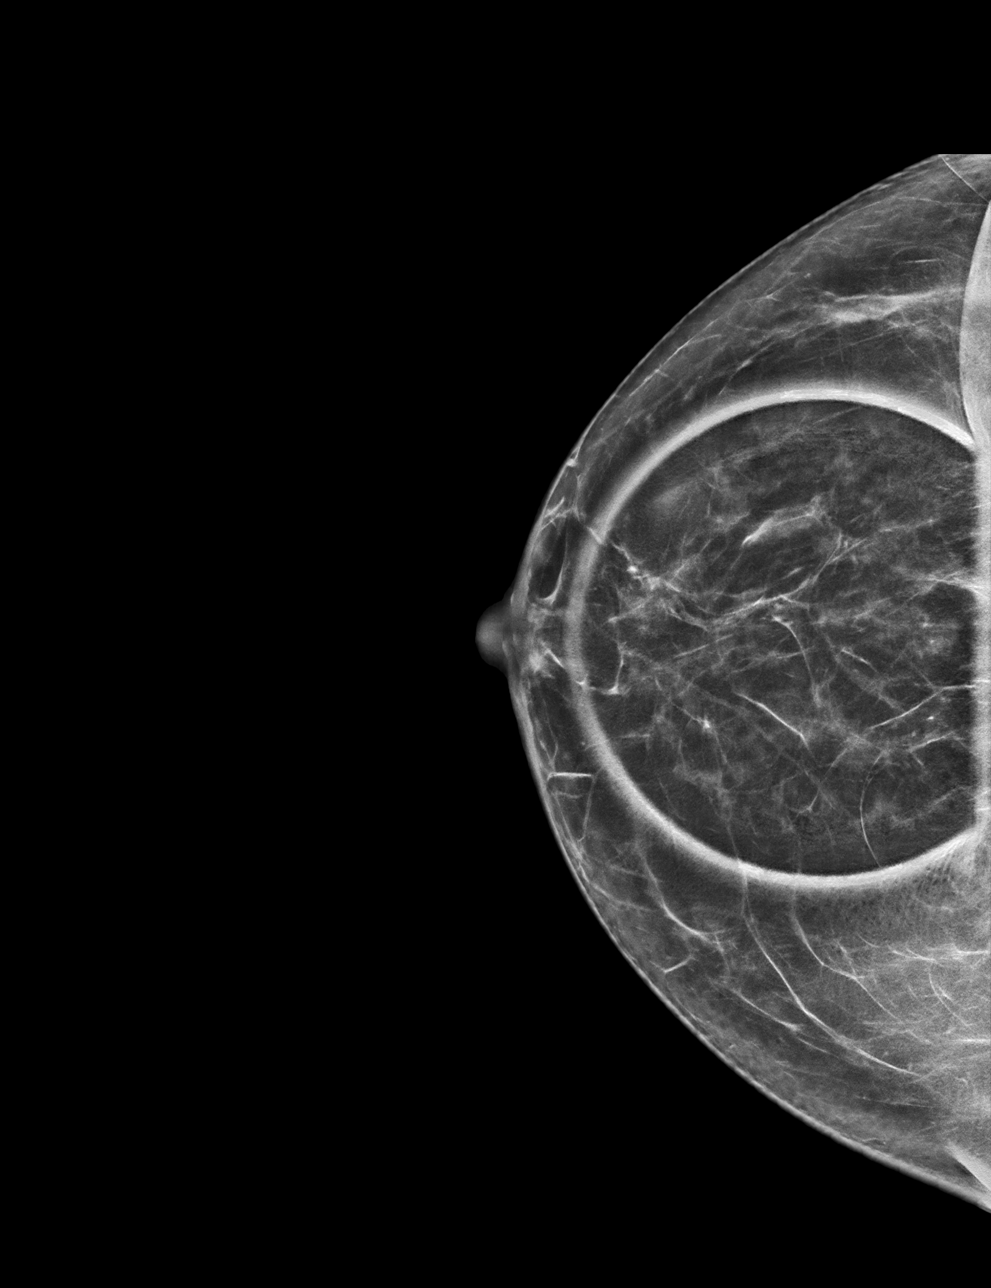

[R MLO tomo · tomo slice 30/59.0]
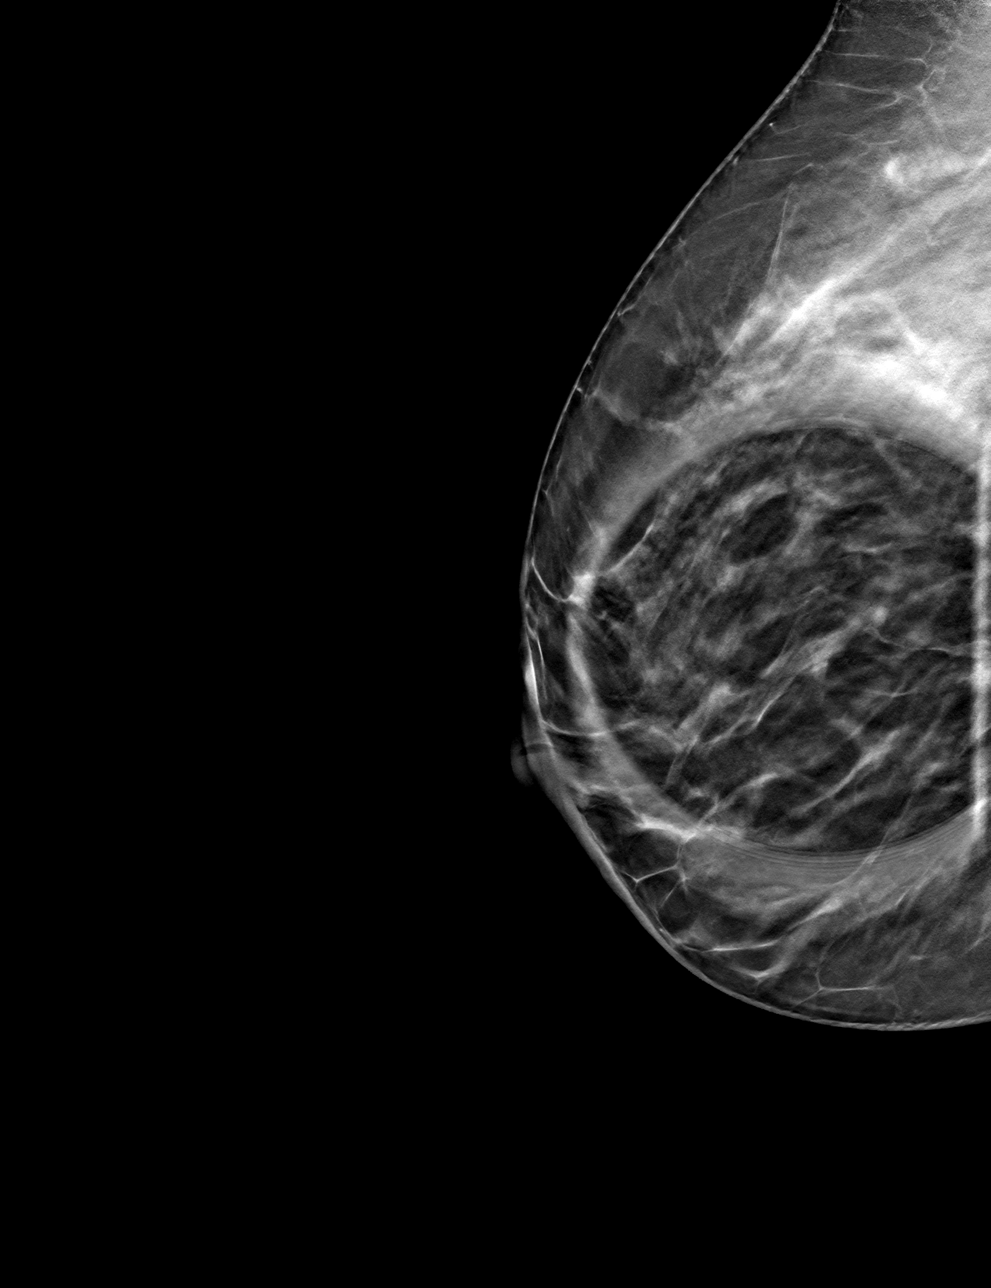

[R CC tomo · tomo slice 31/60.0]
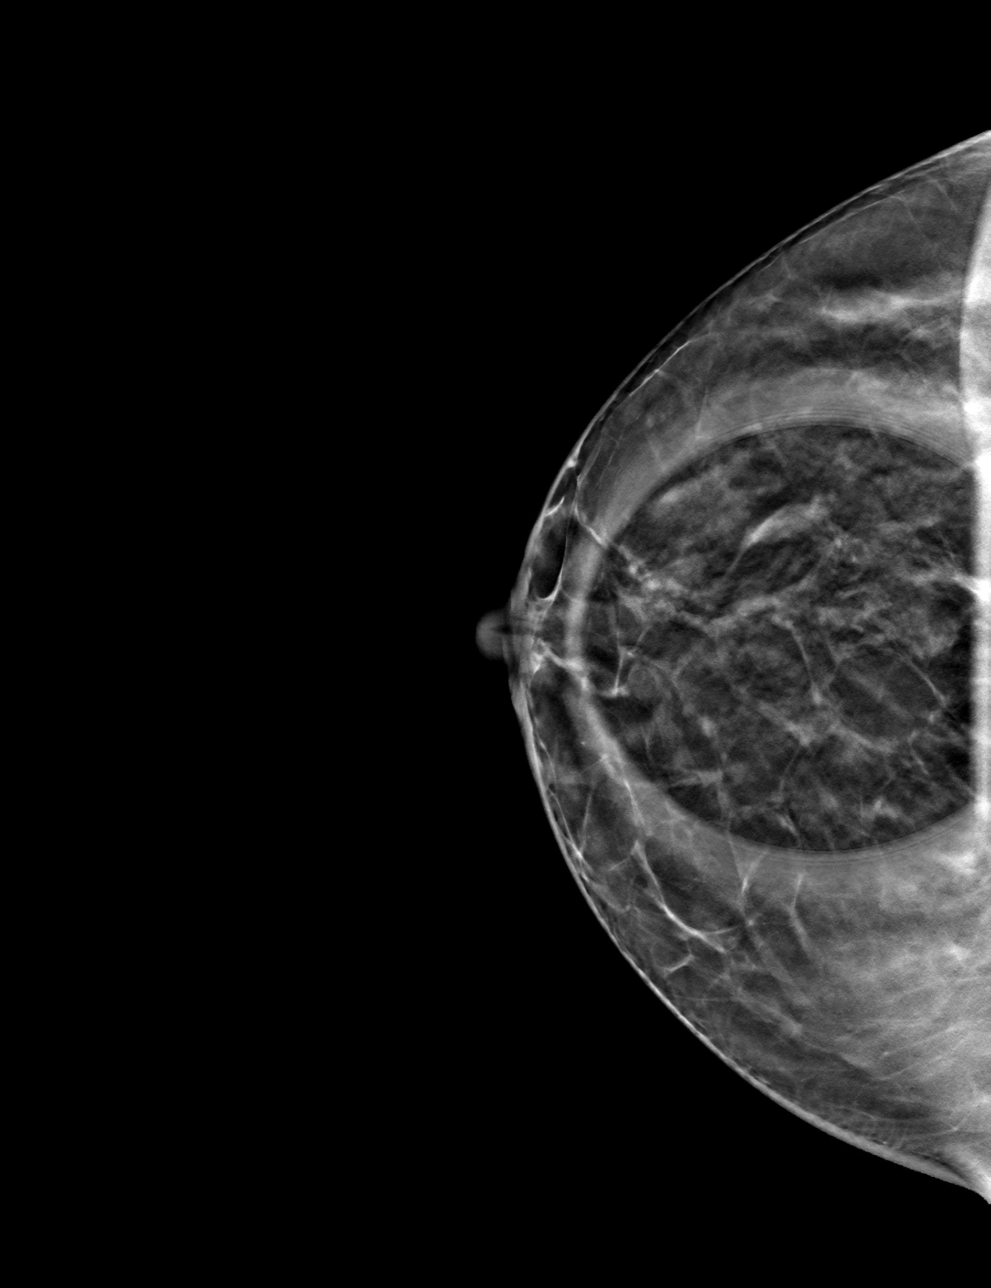

[4 of 12 positions shown; findings below may reference images not displayed]

ACR Breast Density Category c: The breast tissue is heterogeneously
dense, which may obscure small masses.
FINDINGS: 3D tomographic and 2D generated spot compression images of the right
breast confirm a small, rounded, partially circumscribed and
partially obscured mass in the central right breast slightly
laterally.

On physical exam, no mass palpable in the outer right breast.

Targeted ultrasound is performed, showing a 6 mm cyst containing a
thin internal septation in the 7:30 o'clock position of the right
breast, 3 cm from the nipple. This corresponds to the mammographic
mass.
IMPRESSION: Benign right breast cyst.  No evidence of malignancy.

RECOMMENDATION:
Bilateral screening mammogram in 1 year when due.

I have discussed the findings and recommendations with the patient.
If applicable, a reminder letter will be sent to the patient
regarding the next appointment.

BI-RADS CATEGORY  2: Benign.

## 2021-11-28 ENCOUNTER — Other Ambulatory Visit: Payer: Self-pay | Admitting: Adult Health

## 2021-11-28 MED ORDER — FEXOFENADINE HCL 180 MG PO TABS: 180.0000 mg | ORAL_TABLET | Freq: Every day | ORAL | 0 refills | Status: DC

## 2022-01-11 ENCOUNTER — Other Ambulatory Visit: Payer: Self-pay | Admitting: Adult Health

## 2022-01-11 DIAGNOSIS — Z6835 Body mass index (BMI) 35.0-35.9, adult: Secondary | ICD-10-CM

## 2022-02-15 ENCOUNTER — Other Ambulatory Visit: Payer: Managed Care, Other (non HMO)

## 2022-02-15 ENCOUNTER — Other Ambulatory Visit: Payer: Self-pay | Admitting: Adult Health

## 2022-02-15 DIAGNOSIS — R300 Dysuria: Secondary | ICD-10-CM

## 2022-02-15 NOTE — Progress Notes (Signed)
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 UA/culture ordered.  ?

## 2022-02-18 LAB — URINALYSIS, ROUTINE W REFLEX MICROSCOPIC
Bilirubin Urine: NEGATIVE
Glucose, UA: NEGATIVE
Hyaline Cast: NONE SEEN /LPF
Ketones, ur: NEGATIVE
Nitrite: NEGATIVE
RBC / HPF: >=60 /HPF — AB (ref 0–2)
Specific Gravity, Urine: 1.024 (ref 1.001–1.035)
WBC, UA: >=60 /HPF — AB (ref 0–5)
pH: 6.5 (ref 5.0–8.0)

## 2022-02-18 LAB — URINE CULTURE
MICRO NUMBER:: 13202394
SPECIMEN QUALITY:: ADEQUATE

## 2022-02-18 LAB — MICROSCOPIC MESSAGE

## 2022-03-14 ENCOUNTER — Telehealth: Payer: Self-pay

## 2022-03-14 NOTE — Telephone Encounter (Signed)
Prior Auth for [employer] approved through 09/11/22. ?

## 2022-03-16 ENCOUNTER — Other Ambulatory Visit: Payer: Self-pay | Admitting: Adult Health

## 2022-03-16 MED ORDER — LIDOCAINE VISCOUS HCL 2 % MT SOLN: 5.0000 mL | OROMUCOSAL | 0 refills | Status: DC | PRN

## 2022-03-16 MED ORDER — AMOXICILLIN-POT CLAVULANATE 875-125 MG PO TABS
ORAL_TABLET | ORAL | 0 refills | Status: DC
Start: 2022-03-16 — End: 2023-01-16

## 2022-03-30 ENCOUNTER — Other Ambulatory Visit: Payer: Self-pay | Admitting: Adult Health

## 2022-03-30 DIAGNOSIS — H109 Unspecified conjunctivitis: Secondary | ICD-10-CM

## 2022-03-30 MED ORDER — NEOMYCIN-POLYMYXIN-DEXAMETH 3.5-10000-0.1 OP SUSP: 1.0000 [drp] | Freq: Four times a day (QID) | OPHTHALMIC | 0 refills | Status: DC

## 2022-04-05 ENCOUNTER — Other Ambulatory Visit: Payer: Self-pay | Admitting: Nurse Practitioner

## 2022-04-05 DIAGNOSIS — B370 Candidal stomatitis: Secondary | ICD-10-CM

## 2022-04-05 MED ORDER — NYSTATIN 100000 UNIT/ML MT SUSP
5.0000 mL | Freq: Four times a day (QID) | OROMUCOSAL | 0 refills | Status: DC
Start: 2022-04-05 — End: 2023-01-16

## 2022-05-01 ENCOUNTER — Other Ambulatory Visit: Payer: Self-pay | Admitting: Adult Health

## 2022-05-01 MED ORDER — PANTOPRAZOLE SODIUM 40 MG PO TBEC: 40.0000 mg | DELAYED_RELEASE_TABLET | Freq: Every day | ORAL | 0 refills | Status: DC

## 2022-05-23 ENCOUNTER — Other Ambulatory Visit: Payer: Self-pay | Admitting: Nurse Practitioner

## 2022-05-23 DIAGNOSIS — Q829 Congenital malformation of skin, unspecified: Secondary | ICD-10-CM

## 2022-05-25 ENCOUNTER — Other Ambulatory Visit: Payer: Self-pay | Admitting: Adult Health

## 2022-06-01 ENCOUNTER — Encounter: Payer: Self-pay | Admitting: Adult Health

## 2022-06-18 ENCOUNTER — Other Ambulatory Visit: Payer: Self-pay | Admitting: Nurse Practitioner

## 2022-06-21 ENCOUNTER — Other Ambulatory Visit: Payer: Self-pay | Admitting: Nurse Practitioner

## 2022-06-21 ENCOUNTER — Other Ambulatory Visit: Payer: Self-pay

## 2022-06-21 DIAGNOSIS — N898 Other specified noninflammatory disorders of vagina: Secondary | ICD-10-CM

## 2022-06-21 DIAGNOSIS — Z113 Encounter for screening for infections with a predominantly sexual mode of transmission: Secondary | ICD-10-CM | POA: Diagnosis not present

## 2022-06-21 DIAGNOSIS — N939 Abnormal uterine and vaginal bleeding, unspecified: Secondary | ICD-10-CM | POA: Diagnosis not present

## 2022-06-21 NOTE — Progress Notes (Signed)
Pt is experiencing thick yellow vaginal discharge- Wet prep taken and submitted

## 2022-06-22 LAB — WET PREP BY MOLECULAR PROBE
Candida species: NOT DETECTED
Gardnerella vaginalis: NOT DETECTED
MICRO NUMBER:: 13733303
SPECIMEN QUALITY:: ADEQUATE
Trichomonas vaginosis: NOT DETECTED

## 2022-06-22 NOTE — Progress Notes (Signed)
All normal- YAY!

## 2022-06-29 ENCOUNTER — Other Ambulatory Visit: Payer: Self-pay | Admitting: Nurse Practitioner

## 2022-06-29 DIAGNOSIS — Z1231 Encounter for screening mammogram for malignant neoplasm of breast: Secondary | ICD-10-CM

## 2022-07-15 ENCOUNTER — Other Ambulatory Visit: Payer: Self-pay | Admitting: Nurse Practitioner

## 2022-07-16 ENCOUNTER — Other Ambulatory Visit: Payer: Self-pay

## 2022-07-16 DIAGNOSIS — Z6835 Body mass index (BMI) 35.0-35.9, adult: Secondary | ICD-10-CM

## 2022-07-16 MED ORDER — WEGOVY 2.4 MG/0.75ML ~~LOC~~ SOAJ: SUBCUTANEOUS | 1 refills | Status: DC

## 2022-07-17 ENCOUNTER — Ambulatory Visit: Payer: 59

## 2022-08-09 ENCOUNTER — Other Ambulatory Visit: Payer: Self-pay | Admitting: Nurse Practitioner

## 2022-08-09 ENCOUNTER — Ambulatory Visit
Admission: RE | Admit: 2022-08-09 | Discharge: 2022-08-09 | Disposition: A | Discharge status: Home / Self Care | Payer: Managed Care, Other (non HMO) | Source: Ambulatory Visit | Attending: Nurse Practitioner | Admitting: Nurse Practitioner

## 2022-08-09 DIAGNOSIS — Z1231 Encounter for screening mammogram for malignant neoplasm of breast: Secondary | ICD-10-CM

## 2022-08-28 ENCOUNTER — Telehealth: Payer: Self-pay

## 2022-08-28 NOTE — Telephone Encounter (Signed)
[employer] prior auth completed and submitted. 

## 2022-08-29 ENCOUNTER — Other Ambulatory Visit: Payer: Self-pay | Admitting: Internal Medicine

## 2022-08-29 DIAGNOSIS — L680 Hirsutism: Secondary | ICD-10-CM

## 2022-08-29 NOTE — Telephone Encounter (Signed)
Prior auth denied. Sent in appeal.

## 2022-09-04 ENCOUNTER — Other Ambulatory Visit: Payer: Self-pay | Admitting: Nurse Practitioner

## 2022-09-05 MED ORDER — EFLORNITHINE HCL 13.9 % EX CREA: TOPICAL_CREAM | CUTANEOUS | 11 refills | Status: DC

## 2022-10-18 ENCOUNTER — Other Ambulatory Visit: Payer: Self-pay

## 2022-10-18 MED ORDER — ONDANSETRON HCL 4 MG PO TABS: 4.0000 mg | ORAL_TABLET | Freq: Three times a day (TID) | ORAL | 0 refills | Status: DC | PRN

## 2022-10-24 NOTE — Progress Notes (Signed)
10/26/2022 Brianna Collins [id number] January 06, 1971  Referring provider: Jettie Pagan, NP Primary GI doctor: Dr. Leone Payor  ASSESSMENT AND PLAN:   Gastroesophageal reflux disease with esophagitis without hemorrhage Likely component of GERD/gastroparesis Increase protonix to BID for 1 month, do sugar free hard candy to try to prevent clearing out of habit, cut back on ETOH, elevated head of bed Gastoparesis diet given Consider cutting back on wegovy  Chronic idiopathic constipation - Increase fiber/ water intake, decrease caffeine, increase activity level. -Will add on Trulance 3 mg daily, did not do well with linzess, consider motegrity and or cutting back on wegovy dose - no obstructive symptoms at this time.  Screening colonoscopy Due 08/2031   History of Present Illness:  51 y.o. female  with a past medical history of depression, tobacco abuse, frequent UTI, asthma, obesity and others listed below, returns to clinic today for evaluation of throat clearing.  08/25/2021 EGD and colonoscopy with Dr. Leone Payor for screening purposes and GERD Colonoscopy showed completely normal recall 10 years Endoscopy showed 2 cm hiatal hernia, normal esophagus, diffuse mild gastritis biopsies negative for H. pylori. 04/27/2022 initial consult ENT [facility] for LPR, globulus sensation.  Around [employer] [facility] showed moderate edema and erythema consistent with LPR  Patient on Protonix 40 mg daily for at least a year.  She states she was having GERd prior to the PPI but since that time she does not have GERD except once a month will take pepcid. She presents for throat clearing, worse in the morning.  She feels she has mucus all the time. She has allegra.  She has no trouble swallowing.  4-5 vodka drinks a week for a long time.  Dad with esophageal cancer history, recently passed from lung cancer.   Has been on Wegovy 2.4 mg once weekly since February 2021, about 2 years in sept. She takes  zofran once or twice a week still, no vomiting.  Given ondansetron 10/18/2022. She has chronic constipation for her entire life.  Will have hard ball every other day, occ nice stools.  Tried linzess gave her severe AB cramps. Colace 3 x a day and magnesium.    She  reports that she quit smoking about 4 years ago. Her smoking use included cigarettes. She started smoking about 33 years ago. She smoked an average of .5 packs per day. She has never used smokeless tobacco. She reports current alcohol use. She reports that she does not use drugs. Her family history includes Barrett's esophagus in her father; Esophageal cancer in her father; Renal cancer in her paternal grandfather; Stroke in her maternal aunt.   Current Medications:    Current Outpatient Medications (Cardiovascular):    verapamil (CALAN-SR) 120 MG CR tablet, Take 240 mg by mouth daily.  Current Outpatient Medications (Respiratory):    fexofenadine (ALLEGRA) 180 MG tablet, Take 1 tablet (180 mg total) by mouth daily.  Current Outpatient Medications (Analgesics):    aspirin 81 MG EC tablet, Take by mouth.   Current Outpatient Medications (Other):    amoxicillin-clavulanate (AUGMENTIN) 875-125 MG tablet, Take 1 tab twice a day with a meal. (Patient not taking: Reported on 10/26/2022)   buPROPion (WELLBUTRIN XL) 300 MG 24 hr tablet, Take 300 mg by mouth at bedtime.   D3-50 1.25 MG (50000 UT) capsule, Take 50,000 Units by mouth 3 (three) times a week.   Docusate Calcium (STOOL SOFTENER PO), Take 1 capsule by mouth as needed.   Eflornithine HCl 13.9 % cream,  Apply to areas of excess facial hair 1 to 2 x /day (Patient not taking: Reported on 10/26/2022)   famotidine (PEPCID) 20 MG tablet, Take 20 mg by mouth 2 (two) times daily.   FLUoxetine (PROZAC) 40 MG capsule, Take 40 mg by mouth at bedtime.   lidocaine (XYLOCAINE) 2 % solution, Use as directed 5-15 mLs in the mouth or throat as needed for mouth pain. (Patient not taking:  Reported on 10/26/2022)   Magnesium 500 MG CAPS, Take by mouth. (Patient not taking: Reported on 08/25/2021)   neomycin-polymyxin b-dexamethasone (MAXITROL) 3.5-10000-0.1 SUSP, Place 1 drop into the right eye 4 (four) times daily. (Patient not taking: Reported on 10/26/2022)   nitrofurantoin, macrocrystal-monohydrate, (MACROBID) 100 MG capsule, Take by mouth.   nystatin (MYCOSTATIN) 100000 UNIT/ML suspension, Take 5 mLs (500,000 Units total) by mouth 4 (four) times daily.   ondansetron (ZOFRAN) 4 MG tablet, Take 1 tablet (4 mg total) by mouth every 8 (eight) hours as needed for nausea or vomiting.   pantoprazole (PROTONIX) 40 MG tablet, Take 1 tablet (40 mg total) by mouth 2 (two) times daily before a meal.   Semaglutide-Weight Management (WEGOVY) 2.4 MG/0.75ML SOAJ, INJECT 2.4 MG INTO THE SKIN ONCE A WEEK.  Surgical History:  She  has a past surgical history that includes Hemorrhoid surgery.  Current Medications, Allergies, Past Medical History, Past Surgical History, Family History and Social History were reviewed in [employer] record.  Physical Exam: BP 134/70   Pulse 87   Wt 134 lb 8 oz (61 kg)   BMI 25.00 kg/m  General:   Pleasant, well developed female in no acute distress Heart : Regular rate and rhythm; no murmurs Pulm: Clear anteriorly; no wheezing Abdomen:  Soft, Flat AB, Active bowel sounds. No tenderness . , No organomegaly appreciated. Rectal: Not evaluated Extremities:  without  edema. Neurologic:  Alert and  oriented x4;  No focal deficits.  Psych:  Cooperative. Normal mood and affect.   Doree Albee, PA-C 10/26/22

## 2022-10-26 ENCOUNTER — Ambulatory Visit (INDEPENDENT_AMBULATORY_CARE_PROVIDER_SITE_OTHER): Payer: Managed Care, Other (non HMO) | Admitting: Physician Assistant

## 2022-10-26 ENCOUNTER — Encounter: Payer: Self-pay | Admitting: Physician Assistant

## 2022-10-26 VITALS — BP 134/70 | HR 87 | Wt 134.5 lb

## 2022-10-26 DIAGNOSIS — K2100 Gastro-esophageal reflux disease with esophagitis, without bleeding: Secondary | ICD-10-CM | POA: Diagnosis not present

## 2022-10-26 DIAGNOSIS — K5904 Chronic idiopathic constipation: Secondary | ICD-10-CM

## 2022-10-26 MED ORDER — PANTOPRAZOLE SODIUM 40 MG PO TBEC: 40.0000 mg | DELAYED_RELEASE_TABLET | Freq: Two times a day (BID) | ORAL | 0 refills | Status: DC

## 2022-10-26 NOTE — Patient Instructions (Addendum)
Please do the following: Purchase a bottle of Miralax over the counter as well as a box of 5 mg dulcolax tablets. Take 4 dulcolax tablets. Wait 1 hour. You will then drink 6-8 capfuls of Miralax mixed in an adequate amount of water/juice/gatorade (you may choose which of these liquids to drink) over the next 2-3 hours. You should expect results within 1 to 6 hours after completing the bowel purge. Go to the er if you have severe AB pain, can not pass gas or stool in over 12 hours, can not hold down any food.   Then start trulance, can be once a day. If this does not work can try the [employer].   Toileting tips to help with your constipation - Drink at least 64-80 ounces of water/liquid per day. - Establish a time to try to move your bowels every day.  For many people, this is after a cup of coffee or after a meal such as breakfast. - Sit all of the way back on the toilet keeping your back fairly straight and while sitting up, try to rest the tops of your forearms on your upper thighs.   - Raising your feet with a step stool/squatty potty can be helpful to improve the angle that allows your stool to pass through the rectum. - Relax the rectum feeling it bulge toward the toilet water.  If you feel your rectum raising toward your body, you are contracting rather than relaxing. - Breathe in and slowly exhale. "Belly breath" by expanding your belly towards your belly button. Keep belly expanded as you gently direct pressure down and back to the anus.  A low pitched GRRR sound can assist with increasing intra-abdominal pressure.  - Repeat 3-4 times. If unsuccessful, contract the pelvic floor to restore normal tone and get off the toilet.  Avoid excessive straining. - To reduce excessive wiping by teaching your anus to normally contract, place hands on outer aspect of knees and resist knee movement outward.  Hold 5-10 second then place hands just inside of knees and resist inward movement of knees.  Hold  5 seconds.  Repeat a few times each way.  Cut back on alcohol, try mock tails.  Elevated head of bed 3 inches.   SUCK ON SUGAR FREE HARD CANDY/CANDY TO PREVENT THE CLEARING  If this does not help try astelin at night and the allegra at night.   Please take your proton pump inhibitor medication, PROTONIX TO TWICE A DAY  Please take this medication 30 minutes to 1 hour before meals- this makes it more effective.  Avoid spicy and acidic foods Avoid fatty foods Limit your intake of coffee, tea, alcohol, and carbonated drinks Work to maintain a healthy weight Keep the head of the bed elevated at least 3 inches with blocks or a wedge pillow if you are having any nighttime symptoms Stay upright for 2 hours after eating Avoid meals and snacks three to four hours before bedtime  Gastroparesis from [facility] Please do small frequent meals like 4-6 meals a day.  Eat and drink liquids at separate times.  Avoid high fiber foods, cook your vegetables, avoid high fat food.  Suggest spreading protein throughout the day (greek yogurt, glucerna, soft meat, milk, eggs) Choose soft foods that you can mash with a fork When you are more symptomatic, change to pureed foods foods and liquids.  Consider reading "Living well with Gastroparesis" by Reuel Derby Gastroparesis is a condition in which food takes longer than normal  to empty from the stomach. This condition is also known as delayed gastric emptying. It is usually a long-term (chronic) condition. There is no cure, but there are treatments and things that you can do at home to help relieve symptoms. Treating the underlying condition that causes gastroparesis can also help relieve symptoms  Thank you for choosing me and [facility].  Quentin Mulling PA-C

## 2022-11-21 ENCOUNTER — Other Ambulatory Visit: Payer: Self-pay | Admitting: Physician Assistant

## 2022-11-21 DIAGNOSIS — K5904 Chronic idiopathic constipation: Secondary | ICD-10-CM

## 2022-11-21 DIAGNOSIS — K2100 Gastro-esophageal reflux disease with esophagitis, without bleeding: Secondary | ICD-10-CM

## 2022-11-21 MED ORDER — PANTOPRAZOLE SODIUM 40 MG PO TBEC: 40.0000 mg | DELAYED_RELEASE_TABLET | Freq: Two times a day (BID) | ORAL | 1 refills | Status: DC

## 2022-11-21 MED ORDER — TRULANCE 3 MG PO TABS: 3.0000 mg | ORAL_TABLET | Freq: Every day | ORAL | 3 refills | Status: AC

## 2022-11-22 ENCOUNTER — Telehealth: Payer: Self-pay | Admitting: Pharmacy Technician

## 2022-11-22 ENCOUNTER — Other Ambulatory Visit (HOSPITAL_COMMUNITY): Payer: Self-pay

## 2022-11-22 NOTE — Telephone Encounter (Signed)
Patient Advocate Encounter  Received notification from [employer] that prior authorization for PANTOPRAZOLE 40MG  is required.   PA submitted on 1.4.24 Key [id number] Status is pending

## 2022-11-22 NOTE — Telephone Encounter (Signed)
PA has been submitted and telephone encounter has been created 

## 2022-11-22 NOTE — Telephone Encounter (Signed)
Please submit PA.

## 2022-11-26 NOTE — Telephone Encounter (Signed)
PA approved.

## 2022-12-29 ENCOUNTER — Other Ambulatory Visit: Payer: Self-pay | Admitting: Physician Assistant

## 2022-12-29 DIAGNOSIS — K5904 Chronic idiopathic constipation: Secondary | ICD-10-CM

## 2023-01-10 ENCOUNTER — Encounter: Payer: 59 | Admitting: Nurse Practitioner

## 2023-01-12 ENCOUNTER — Other Ambulatory Visit: Payer: Self-pay | Admitting: Nurse Practitioner

## 2023-01-12 DIAGNOSIS — Z6835 Body mass index (BMI) 35.0-35.9, adult: Secondary | ICD-10-CM

## 2023-01-15 NOTE — Progress Notes (Unsigned)
 Complete Physical  Assessment and Plan:  Brianna Collins was seen today for annual exam.  Diagnoses and all orders for this visit:  Encounter for general adult medical examination with abnormal findings Due Yearly Mammogram and Pap are UTD  Inappropriate sinus tachycardia Currently controlled on Verapamil 120 mg 2 tabs daily Monitor symptoms  Constipation - Continue Trulance and push fluids and increase fiber in diet  Major depressive disorder in full remission, unspecified whether recurrent (HCC) Well controlled on Wellbutrin 300 mg QD Continue diet, exercise and good sleep hygiene  Overweight Long discussion about weight loss, diet, and exercise Recommended diet heavy in fruits and veggies and low in animal meats, cheeses, and dairy products, appropriate calorie intake Patient will work on decreasing saturated fats and simple carbs. Increase exercise Continue Wegovy for now does feel she has plateaued. She has used [place] in the past as well.  Follow up at next visit  Screening for ischemic heart disease -     EKG 12-Lead  Screening for hematuria or proteinuria -     Microalbumin / creatinine urine ratio -     Urinalysis w microscopic + reflex cultur  Medication management -     CBC with Differential/Platelet -     COMPLETE METABOLIC PANEL WITH GFR -     Magnesium -     Lipid panel -     TSH -     Hemoglobin A1c -     VITAMIN D 25 Hydroxy (Vit-D Deficiency, Fractures) -     EKG 12-Lead -     [place], RETROPERITNL ABD,  LTD -     Microalbumin / creatinine urine ratio -     Urinalysis w microscopic + reflex cultur  Vitamin D deficiency Continue Vit D supplementation to maintain value in therapeutic level of 60-100  -     VITAMIN D 25 Hydroxy (Vit-D Deficiency, Fractures)  Screening for thyroid disorder -     TSH  Screening for diabetes mellitus -     Hemoglobin A1c  Screening for AAA (abdominal aortic aneurysm) -     [place], RETROPERITNL ABD,  LTD  Screening, lipid -      Lipid panel  Abnormal menstrual periods Periods are coming irregular and is having spotting in between cycles -     Follicle stimulating hormone -     LH  Skin lesion of left leg Light brown irregularly shaped area with darker brown at top on back of left leg -     Ambulatory referral to Dermatology    Discussed med's effects and SE's. Screening labs and tests as requested with regular follow-up as recommended. Over 40 minutes of exam, counseling, chart review, and complex, high level critical decision making was performed this visit.   HPI  52 y.o. female  presents for a complete physical and follow up for has COVID-19; Asthma; Depression; Frequent UTI; Inappropriate sinus tachycardia; and Tobacco abuse on their problem list..  Her blood pressure has been controlled at home, today their BP is BP: (!) 102/58  BP Readings from Last 3 Encounters:  01/16/23 (!) 102/58  10/26/22 134/70  08/25/21 (!) 152/98  She does workout. She denies chest pain, shortness of breath, dizziness.   She is currently on protonix for GERD, is to be on twice a day per GI and will take BID as needed but mostly QD.   Constipation is persistent. She is on trulance 3 mg daily.   She is not on cholesterol medication . Her cholesterol  is at goal. The cholesterol last visit was:  Her last cholesterol at previous PCP was Total: 181, HDL 81, LDL 87, Triglycerides 70  BMI is Body mass index is 25.3 kg/m., she has been working on diet and exercise. Wt Readings from Last 3 Encounters:  01/16/23 135 lb (61.2 kg)  10/26/22 134 lb 8 oz (61 kg)  08/25/21 134 lb (60.8 kg)    Last A1C at previous PCP was: 5.1  Last GFR at previous PCP was: 52  Last Vit D at previous PCP was: 47    Current Medications:  Current Outpatient Medications on File Prior to Visit  Medication Sig Dispense Refill   Ascorbic Acid (VITAMIN C) 1000 MG tablet Take 1,000 mg by mouth daily.     buPROPion (WELLBUTRIN XL) 300 MG 24 hr tablet  Take 300 mg by mouth at bedtime.     cetirizine (ZYRTEC) 10 MG tablet Take 10 mg by mouth daily.     Docusate Calcium (STOOL SOFTENER PO) Take 1 capsule by mouth as needed.     FLUoxetine (PROZAC) 40 MG capsule Take 40 mg by mouth at bedtime.     ondansetron (ZOFRAN) 4 MG tablet Take 1 tablet (4 mg total) by mouth every 8 (eight) hours as needed for nausea or vomiting. 60 tablet 0   pantoprazole (PROTONIX) 40 MG tablet TAKE 1 TABLET (40 MG TOTAL) BY MOUTH TWICE A DAY BEFORE MEALS 180 tablet 1   Plecanatide (TRULANCE) 3 MG TABS Take 3 mg by mouth daily. 90 tablet 3   Semaglutide-Weight Management (WEGOVY) 2.4 MG/0.75ML SOAJ INJECT 2.4 MG INTO THE SKIN ONCE A WEEK. 3 mL 5   verapamil (CALAN-SR) 120 MG CR tablet Take 240 mg by mouth daily.     Vitamin D-Vitamin K (VITAMIN K2-VITAMIN D3 PO) Take by mouth.     amoxicillin-clavulanate (AUGMENTIN) 875-125 MG tablet Take 1 tab twice a day with a meal. (Patient not taking: Reported on 01/16/2023) 14 tablet 0   aspirin 81 MG EC tablet Take by mouth. (Patient not taking: Reported on 01/16/2023)     D3-50 1.25 MG (50000 UT) capsule Take 50,000 Units by mouth 3 (three) times a week. (Patient not taking: Reported on 01/16/2023)     Eflornithine HCl 13.9 % cream Apply to areas of excess facial hair 1 to 2 x /day (Patient not taking: Reported on 10/26/2022) 45 g 11   famotidine (PEPCID) 20 MG tablet Take 20 mg by mouth 2 (two) times daily. (Patient not taking: Reported on 01/16/2023)     fexofenadine (ALLEGRA) 180 MG tablet Take 1 tablet (180 mg total) by mouth daily. (Patient not taking: Reported on 01/16/2023) 30 tablet 0   lidocaine (XYLOCAINE) 2 % solution Use as directed 5-15 mLs in the mouth or throat as needed for mouth pain. (Patient not taking: Reported on 10/26/2022) 100 mL 0   Magnesium 500 MG CAPS Take by mouth. (Patient not taking: Reported on 08/25/2021)     neomycin-polymyxin b-dexamethasone (MAXITROL) 3.5-10000-0.1 SUSP Place 1 drop into the right eye 4  (four) times daily. (Patient not taking: Reported on 10/26/2022) 5 mL 0   nitrofurantoin, macrocrystal-monohydrate, (MACROBID) 100 MG capsule Take by mouth. (Patient not taking: Reported on 01/16/2023)     nystatin (MYCOSTATIN) 100000 UNIT/ML suspension Take 5 mLs (500,000 Units total) by mouth 4 (four) times daily. (Patient not taking: Reported on 01/16/2023) 473 mL 0   No current facility-administered medications on file prior to visit.   Allergies:  No Known  Allergies Medical History:  She has COVID-19; Asthma; Depression; Frequent UTI; Inappropriate sinus tachycardia; and Tobacco abuse on their problem list. Health Maintenance:   Immunization History  Administered Date(s) Administered   Influenza,inj,Quad PF,6+ Mos 09/22/2021   PFIZER(Purple Top)SARS-COV-2 Vaccination 07/09/2020   Tdap 09/19/2013    Health Maintenance  Topic Date Due   HIV Screening  Never done   Hepatitis C Screening  Never done   Zoster Vaccines- Shingrix (1 of 2) Never done   PAP SMEAR-Modifier  Never done   COVID-19 Vaccine (2 - Pfizer risk series) 07/30/2020   INFLUENZA VACCINE  06/19/2022   DTaP/Tdap/Td (2 - Td or Tdap) 09/20/2023   MAMMOGRAM  08/09/2024   COLONOSCOPY (Pts 45-66yr Insurance coverage will need to be confirmed)  08/26/2031   HPV VACCINES  Aged Out    Last Dental Exam: Last Eye Exam: Patient Care Team: WAlycia Rossetti NP as PCP - General (Nurse Practitioner)  Surgical History:  She has a past surgical history that includes Hemorrhoid surgery. Family History:  Herfamily history includes Barrett's esophagus in her father; Esophageal cancer in her father; Renal cancer in her paternal grandfather; Stroke in her maternal aunt. Social History:  She reports that she quit smoking about 5 years ago. Her smoking use included cigarettes. She started smoking about 34 years ago. She smoked an average of .5 packs per day. She has never used smokeless tobacco. She reports current alcohol use. She  reports that she does not use drugs.  Review of Systems: Review of Systems  Constitutional:  Negative for chills and fever.  HENT:  Negative for congestion, hearing loss, sinus pain, sore throat and tinnitus.   Eyes:  Negative for blurred vision and double vision.  Respiratory:  Negative for cough, hemoptysis, sputum production, shortness of breath and wheezing.   Cardiovascular:  Negative for chest pain, palpitations and leg swelling.  Gastrointestinal:  Positive for constipation and heartburn. Negative for abdominal pain, diarrhea, nausea and vomiting.  Genitourinary:  Negative for dysuria and urgency.       Irregular menstrual cycles   Musculoskeletal:  Negative for back pain, falls, joint pain, myalgias and neck pain.  Skin:  Negative for rash.  Neurological:  Negative for dizziness, tingling, tremors, weakness and headaches.  Endo/Heme/Allergies:  Does not bruise/bleed easily.  Psychiatric/Behavioral:  Negative for depression and suicidal ideas. The patient is not nervous/anxious and does not have insomnia.     Physical Exam: Estimated body mass index is 25.3 kg/m as calculated from the following:   Height as of this encounter: 5' 1.25" (1.556 m).   Weight as of this encounter: 135 lb (61.2 kg). BP (!) 102/58   Pulse 85   Temp 98.8 F (37.1 C)   Ht 5' 1.25" (1.556 m)   Wt 135 lb (61.2 kg)   SpO2 97%   BMI 25.30 kg/m  General Appearance: Well nourished, in no apparent distress.  Eyes: PERRLA, EOMs, conjunctiva no swelling or erythema, normal fundi and vessels.  Sinuses: No Frontal/maxillary tenderness  ENT/Mouth: Ext aud canals clear, normal light reflex with TMs without erythema, bulging. Good dentition. No erythema, swelling, or exudate on post pharynx. Hearing normal.  Neck: Supple, thyroid normal. No bruits  Respiratory: Respiratory effort normal, BS equal bilaterally without rales, rhonchi, wheezing or stridor.  Cardio: RRR without murmurs, rubs or gallops. Brisk  peripheral pulses without edema.  Chest: symmetric, with normal excursions and percussion.  Breasts: Symmetric, without lumps, nipple discharge, retractions.  Abdomen: Positive bowel sounds all  4 quadrants, Soft, nontender, no guarding, rebound, hernias, masses, or organomegaly.  Lymphatics: Non tender without lymphadenopathy.  Genitourinary: External genitalia without erythema, exudate or discharge. Cervix noraml color without lesions, os closed.  Uterus of normal size and nontender. No CMT noted. Adnexa without masses or tenderness. Musculoskeletal: Full ROM all peripheral extremities,5/5 strength, and normal gait.  Skin: Warm, dry without rashes, lesions, ecchymosis. Neuro: Cranial nerves intact, reflexes equal bilaterally. Normal muscle tone, no cerebellar symptoms. Sensation intact.  Psych: Awake and oriented X 3, normal affect, Insight and Judgment appropriate.   EKG: NSR no ST changes. AORTA SCAN: < 3 cm   Brianna Collins E  9:13 AM [facility]

## 2023-01-16 ENCOUNTER — Ambulatory Visit (INDEPENDENT_AMBULATORY_CARE_PROVIDER_SITE_OTHER): Payer: 59 | Admitting: Nurse Practitioner

## 2023-01-16 ENCOUNTER — Encounter: Payer: Self-pay | Admitting: Nurse Practitioner

## 2023-01-16 VITALS — BP 102/58 | HR 85 | Temp 98.8°F | Ht 61.25 in | Wt 135.0 lb

## 2023-01-16 DIAGNOSIS — N926 Irregular menstruation, unspecified: Secondary | ICD-10-CM

## 2023-01-16 DIAGNOSIS — E663 Overweight: Secondary | ICD-10-CM

## 2023-01-16 DIAGNOSIS — Z131 Encounter for screening for diabetes mellitus: Secondary | ICD-10-CM

## 2023-01-16 DIAGNOSIS — Z0000 Encounter for general adult medical examination without abnormal findings: Secondary | ICD-10-CM | POA: Diagnosis not present

## 2023-01-16 DIAGNOSIS — I700 Atherosclerosis of aorta: Secondary | ICD-10-CM

## 2023-01-16 DIAGNOSIS — Z136 Encounter for screening for cardiovascular disorders: Secondary | ICD-10-CM

## 2023-01-16 DIAGNOSIS — I10 Essential (primary) hypertension: Secondary | ICD-10-CM

## 2023-01-16 DIAGNOSIS — J4530 Mild persistent asthma, uncomplicated: Secondary | ICD-10-CM

## 2023-01-16 DIAGNOSIS — Z79899 Other long term (current) drug therapy: Secondary | ICD-10-CM

## 2023-01-16 DIAGNOSIS — Z1389 Encounter for screening for other disorder: Secondary | ICD-10-CM

## 2023-01-16 DIAGNOSIS — Z1329 Encounter for screening for other suspected endocrine disorder: Secondary | ICD-10-CM

## 2023-01-16 DIAGNOSIS — Z0001 Encounter for general adult medical examination with abnormal findings: Secondary | ICD-10-CM

## 2023-01-16 DIAGNOSIS — I4711 Inappropriate sinus tachycardia, so stated: Secondary | ICD-10-CM

## 2023-01-16 DIAGNOSIS — F325 Major depressive disorder, single episode, in full remission: Secondary | ICD-10-CM

## 2023-01-16 DIAGNOSIS — K5909 Other constipation: Secondary | ICD-10-CM

## 2023-01-16 DIAGNOSIS — E559 Vitamin D deficiency, unspecified: Secondary | ICD-10-CM

## 2023-01-16 DIAGNOSIS — Z13220 Encounter for screening for lipoid disorders: Secondary | ICD-10-CM

## 2023-01-16 DIAGNOSIS — L989 Disorder of the skin and subcutaneous tissue, unspecified: Secondary | ICD-10-CM

## 2023-01-16 NOTE — Patient Instructions (Signed)

## 2023-01-17 LAB — MAGNESIUM: Magnesium: 1.9 mg/dL (ref 1.5–2.5)

## 2023-01-17 LAB — URINALYSIS W MICROSCOPIC + REFLEX CULTURE
Bacteria, UA: NONE SEEN /HPF
Bilirubin Urine: NEGATIVE
Glucose, UA: NEGATIVE
Hgb urine dipstick: NEGATIVE
Hyaline Cast: NONE SEEN /LPF
Ketones, ur: NEGATIVE
Leukocyte Esterase: NEGATIVE
Nitrites, Initial: NEGATIVE
Protein, ur: NEGATIVE
RBC / HPF: NONE SEEN /HPF (ref 0–2)
Specific Gravity, Urine: 1.025 (ref 1.001–1.035)
WBC, UA: NONE SEEN /HPF (ref 0–5)
pH: 6 (ref 5.0–8.0)

## 2023-01-17 LAB — LIPID PANEL
Cholesterol: 191 mg/dL (ref <200)
HDL: 105 mg/dL (ref >=50)
LDL Cholesterol (Calc): 73 mg/dL (calc)
Non-HDL Cholesterol (Calc): 86 mg/dL (calc) (ref <130)
Total CHOL/HDL Ratio: 1.8 (calc) (ref <5.0)
Triglycerides: 58 mg/dL (ref <150)

## 2023-01-17 LAB — COMPLETE METABOLIC PANEL WITH GFR
AG Ratio: 1.8 (calc) (ref 1.0–2.5)
ALT: 11 U/L (ref 6–29)
AST: 14 U/L (ref 10–35)
Albumin: 4.3 g/dL (ref 3.6–5.1)
Alkaline phosphatase (APISO): 44 U/L (ref 37–153)
BUN: 13 mg/dL (ref 7–25)
CO2: 25 mmol/L (ref 20–32)
Calcium: 9.1 mg/dL (ref 8.6–10.4)
Chloride: 105 mmol/L (ref 98–110)
Creat: 0.78 mg/dL (ref 0.50–1.03)
Globulin: 2.4 g/dL (calc) (ref 1.9–3.7)
Glucose, Bld: 74 mg/dL (ref 65–99)
Potassium: 4.3 mmol/L (ref 3.5–5.3)
Sodium: 140 mmol/L (ref 135–146)
Total Bilirubin: 0.6 mg/dL (ref 0.2–1.2)
Total Protein: 6.7 g/dL (ref 6.1–8.1)
eGFR: 92 mL/min/{1.73_m2} (ref >=60)

## 2023-01-17 LAB — HEMOGLOBIN A1C
Hgb A1c MFr Bld: 5 % of total Hgb (ref <5.7)
Mean Plasma Glucose: 97 mg/dL
eAG (mmol/L): 5.4 mmol/L

## 2023-01-17 LAB — CBC WITH DIFFERENTIAL/PLATELET
Absolute Monocytes: 389 cells/uL (ref 200–950)
Basophils Absolute: 70 cells/uL (ref 0–200)
Basophils Relative: 1.9 %
Eosinophils Absolute: 70 cells/uL (ref 15–500)
Eosinophils Relative: 1.9 %
HCT: 38.5 % (ref 35.0–45.0)
Hemoglobin: 13.2 g/dL (ref 11.7–15.5)
Lymphs Abs: 1132 cells/uL (ref 850–3900)
MCH: 32.3 pg (ref 27.0–33.0)
MCHC: 34.3 g/dL (ref 32.0–36.0)
MCV: 94.1 fL (ref 80.0–100.0)
MPV: 9.2 fL (ref 7.5–12.5)
Monocytes Relative: 10.5 %
Neutro Abs: 2039 cells/uL (ref 1500–7800)
Neutrophils Relative %: 55.1 %
Platelets: 309 10*3/uL (ref 140–400)
RBC: 4.09 10*6/uL (ref 3.80–5.10)
RDW: 12 % (ref 11.0–15.0)
Total Lymphocyte: 30.6 %
WBC: 3.7 10*3/uL — ABNORMAL LOW (ref 3.8–10.8)

## 2023-01-17 LAB — MICROALBUMIN / CREATININE URINE RATIO
Creatinine, Urine: 133 mg/dL (ref 20–275)
Microalb Creat Ratio: 3 mcg/mg creat (ref <30)
Microalb, Ur: 0.4 mg/dL

## 2023-01-17 LAB — LUTEINIZING HORMONE: LH: 8 m[IU]/mL

## 2023-01-17 LAB — TSH: TSH: 1.34 mIU/L

## 2023-01-17 LAB — NO CULTURE INDICATED

## 2023-01-17 LAB — VITAMIN D 25 HYDROXY (VIT D DEFICIENCY, FRACTURES): Vit D, 25-Hydroxy: 58 ng/mL (ref 30–100)

## 2023-01-17 LAB — FOLLICLE STIMULATING HORMONE: FSH: 11.7 m[IU]/mL

## 2023-01-24 ENCOUNTER — Telehealth: Payer: Self-pay | Admitting: Physician Assistant

## 2023-01-24 NOTE — Telephone Encounter (Signed)
Inbound call from patient states her insurance requires a Prior Authorization for [employer].

## 2023-01-25 ENCOUNTER — Telehealth: Payer: Self-pay | Admitting: Pharmacy Technician

## 2023-01-25 ENCOUNTER — Other Ambulatory Visit (HOSPITAL_COMMUNITY): Payer: Self-pay

## 2023-01-25 NOTE — Telephone Encounter (Signed)
Patient Advocate Encounter  Received notification from [employer] that prior authorization for TRULANCE '3MG'$  is required.   PA submitted on 3.8.24 Key [id number] Status is pending

## 2023-01-25 NOTE — Telephone Encounter (Signed)
PA submitted and is pending determination, will update in additional encounter created.  

## 2023-01-25 NOTE — Telephone Encounter (Signed)
PA has been submitted, will update in additional encounter created.

## 2023-01-30 ENCOUNTER — Other Ambulatory Visit: Payer: Self-pay | Admitting: Nurse Practitioner

## 2023-01-30 DIAGNOSIS — N390 Urinary tract infection, site not specified: Secondary | ICD-10-CM

## 2023-01-30 MED ORDER — NITROFURANTOIN MONOHYD MACRO 100 MG PO CAPS: ORAL_CAPSULE | ORAL | 2 refills | Status: DC

## 2023-02-04 NOTE — Telephone Encounter (Signed)
PA has been DENIED due to:   Per your health plan's criteria, this drug is covered if you meet the following: (1) One of the following: (A) There are paid claims or your doctor provides medical records (for example: chart notes) showing you have tried or cannot use at least three covered (formulary equivalent) drugs (if only one or only two covered drugs are available, you must have tried or cannot use all that are available). Covered drugs include: (I) Linzess*. (II) Lubiprostone. (III) Motegrity*. (B) No formulary or over-the-counter (OTC) drug is appropriate to treat your condition. The information provided does not show that you meet the criteria listed above.

## 2023-02-05 NOTE — Telephone Encounter (Signed)
Sent patient message inregards to PA.  Can try amitiza or motegrity if she has not tried those.

## 2023-02-27 MED ORDER — LUBIPROSTONE 24 MCG PO CAPS: 24.0000 ug | ORAL_CAPSULE | Freq: Two times a day (BID) | ORAL | 0 refills | Status: AC

## 2023-05-10 NOTE — Telephone Encounter (Signed)
Patient Advocate Encounter  Prior Authorization for [employer] has been approved with PANTOPRAZOLE 40MG .    PA# [id number] Effective dates: 1.5.24 through 1.4.25

## 2023-05-29 NOTE — Progress Notes (Unsigned)
Assessment and Plan:  There are no diagnoses linked to this encounter.    Further disposition pending results of labs. Discussed med's effects and SE's.   Over 30 minutes of exam, counseling, chart review, and critical decision making was performed.   Future Appointments  Date Time Provider Department Center  05/30/2023  9:30 AM Raynelle Dick, NP [facility] None  01/17/2024  9:00 AM Raynelle Dick, NP [facility] None    ------------------------------------------------------------------------------------------------------------------   HPI There were no vitals taken for this visit. 52 y.o.female presents for  Past Medical History:  Diagnosis Date   Asthma    Depressive disorder    GAD (generalized anxiety disorder)    Hypertension    Obesity      No Known Allergies  Current Outpatient Medications on File Prior to Visit  Medication Sig   Ascorbic Acid (VITAMIN C) 1000 MG tablet Take 1,000 mg by mouth daily.   buPROPion (WELLBUTRIN XL) 300 MG 24 hr tablet Take 300 mg by mouth at bedtime.   cetirizine (ZYRTEC) 10 MG tablet Take 10 mg by mouth daily.   Docusate Calcium (STOOL SOFTENER PO) Take 1 capsule by mouth as needed.   FLUoxetine (PROZAC) 40 MG capsule Take 40 mg by mouth at bedtime.   nitrofurantoin, macrocrystal-monohydrate, (MACROBID) 100 MG capsule 1 tab after intercourse   ondansetron (ZOFRAN) 4 MG tablet Take 1 tablet (4 mg total) by mouth every 8 (eight) hours as needed for nausea or vomiting.   pantoprazole (PROTONIX) 40 MG tablet TAKE 1 TABLET (40 MG TOTAL) BY MOUTH TWICE A DAY BEFORE MEALS   Semaglutide-Weight Management (WEGOVY) 2.4 MG/0.75ML SOAJ INJECT 2.4 MG INTO THE SKIN ONCE A WEEK.   verapamil (CALAN-SR) 120 MG CR tablet Take 240 mg by mouth daily.   Vitamin D-Vitamin K (VITAMIN K2-VITAMIN D3 PO) Take by mouth.   No current facility-administered medications on file prior to visit.    ROS: all negative except above.   Physical Exam:  There  were no vitals taken for this visit.  General Appearance: Well nourished, in no apparent distress. Eyes: PERRLA, EOMs, conjunctiva no swelling or erythema Sinuses: No Frontal/maxillary tenderness ENT/Mouth: Ext aud canals clear, TMs without erythema, bulging. No erythema, swelling, or exudate on post pharynx.  Tonsils not swollen or erythematous. Hearing normal.  Neck: Supple, thyroid normal.  Respiratory: Respiratory effort normal, BS equal bilaterally without rales, rhonchi, wheezing or stridor.  Cardio: RRR with no MRGs. Brisk peripheral pulses without edema.  Abdomen: Soft, + BS.  Non tender, no guarding, rebound, hernias, masses. Lymphatics: Non tender without lymphadenopathy.  Musculoskeletal: Full ROM, 5/5 strength, normal gait.  Skin: Warm, dry without rashes, lesions, ecchymosis.  Neuro: Cranial nerves intact. Normal muscle tone, no cerebellar symptoms. Sensation intact.  Psych: Awake and oriented X 3, normal affect, Insight and Judgment appropriate.     Raynelle Dick, NP 12:20 PM [facility]

## 2023-05-30 ENCOUNTER — Ambulatory Visit (INDEPENDENT_AMBULATORY_CARE_PROVIDER_SITE_OTHER): Payer: 59 | Admitting: Nurse Practitioner

## 2023-05-30 ENCOUNTER — Other Ambulatory Visit (HOSPITAL_BASED_OUTPATIENT_CLINIC_OR_DEPARTMENT_OTHER): Payer: Self-pay

## 2023-05-30 ENCOUNTER — Encounter: Payer: Self-pay | Admitting: Nurse Practitioner

## 2023-05-30 VITALS — BP 104/62 | HR 94 | Temp 97.7°F | Ht 61.0 in | Wt 145.0 lb

## 2023-05-30 DIAGNOSIS — E663 Overweight: Secondary | ICD-10-CM | POA: Diagnosis not present

## 2023-05-30 DIAGNOSIS — I4711 Inappropriate sinus tachycardia, so stated: Secondary | ICD-10-CM

## 2023-05-30 DIAGNOSIS — Z79899 Other long term (current) drug therapy: Secondary | ICD-10-CM

## 2023-05-30 MED ORDER — ZEPBOUND 10 MG/0.5ML ~~LOC~~ SOAJ
10.0000 mg | SUBCUTANEOUS | 2 refills | Status: DC
  Filled 2023-05-30: qty 2, 28d supply, fill #0

## 2023-05-30 NOTE — Patient Instructions (Signed)
Fair life protein shakes Eat more frequently - try not to go more than 6 hours without protein Aim for 90 grams of protein a day- 30 breakfast/30 lunch 30 dinner Try to keep net carbs less than 50 Net Carbs=Total Carbs-fiber- sugar alcohols Exercise heartrate 120-140(fat burning zone)- walking 20-30 minutes 4 days a week  

## 2023-05-31 ENCOUNTER — Other Ambulatory Visit (HOSPITAL_BASED_OUTPATIENT_CLINIC_OR_DEPARTMENT_OTHER): Payer: Self-pay

## 2023-05-31 ENCOUNTER — Other Ambulatory Visit: Payer: 59

## 2023-05-31 ENCOUNTER — Encounter (HOSPITAL_BASED_OUTPATIENT_CLINIC_OR_DEPARTMENT_OTHER): Payer: Self-pay

## 2023-05-31 NOTE — Addendum Note (Signed)
Addended by: Anda Kraft E on: 05/31/2023 09:10 AM   Modules accepted: Orders

## 2023-06-03 LAB — COMPLETE METABOLIC PANEL WITH GFR
AG Ratio: 1.8 (calc) (ref 1.0–2.5)
Albumin: 4.4 g/dL (ref 3.6–5.1)
BUN: 18 mg/dL (ref 7–25)
Calcium: 9.5 mg/dL (ref 8.6–10.4)
Creat: 0.78 mg/dL (ref 0.50–1.03)
Globulin: 2.4 g/dL (calc) (ref 1.9–3.7)
Potassium: 4.5 mmol/L (ref 3.5–5.3)
Sodium: 139 mmol/L (ref 135–146)

## 2023-06-03 LAB — HEMOGLOBIN A1C W/OUT EAG: Hgb A1c MFr Bld: 5.1 % of total Hgb (ref <5.7)

## 2023-06-03 LAB — TSH: TSH: 1.49 mIU/L

## 2023-06-03 LAB — LIPID PANEL
Cholesterol: 205 mg/dL — ABNORMAL HIGH (ref <200)
HDL: 116 mg/dL (ref >=50)
LDL Cholesterol (Calc): 77 mg/dL (calc)
Total CHOL/HDL Ratio: 1.8 (calc) (ref <5.0)

## 2023-06-04 LAB — LIPID PANEL
Non-HDL Cholesterol (Calc): 89 mg/dL (calc) (ref <130)
Triglycerides: 50 mg/dL (ref <150)

## 2023-06-04 LAB — COMPLETE METABOLIC PANEL WITH GFR
ALT: 20 U/L (ref 6–29)
AST: 20 U/L (ref 10–35)
Alkaline phosphatase (APISO): 51 U/L (ref 37–153)
CO2: 28 mmol/L (ref 20–32)
Chloride: 102 mmol/L (ref 98–110)
Glucose, Bld: 72 mg/dL (ref 65–99)
Total Bilirubin: 0.4 mg/dL (ref 0.2–1.2)
Total Protein: 6.8 g/dL (ref 6.1–8.1)
eGFR: 92 mL/min/{1.73_m2} (ref >=60)

## 2023-06-04 LAB — INSULIN, RANDOM: Insulin: 10.1 u[IU]/mL

## 2023-06-04 LAB — C-REACTIVE PROTEIN: CRP: <3 mg/L (ref <8.0)

## 2023-06-14 ENCOUNTER — Other Ambulatory Visit (HOSPITAL_BASED_OUTPATIENT_CLINIC_OR_DEPARTMENT_OTHER): Payer: Self-pay

## 2023-06-14 MED ORDER — ZEPBOUND 10 MG/0.5ML ~~LOC~~ SOAJ
10.0000 mg | SUBCUTANEOUS | 0 refills | Status: DC
  Filled 2023-06-14 – 2023-06-20 (×3): qty 2, 28d supply, fill #0

## 2023-06-14 MED ORDER — ZEPBOUND 12.5 MG/0.5ML ~~LOC~~ SOAJ
SUBCUTANEOUS | 0 refills | Status: DC
  Filled 2023-07-06: qty 2, 28d supply, fill #0

## 2023-06-14 MED ORDER — ZEPBOUND 15 MG/0.5ML ~~LOC~~ SOAJ
SUBCUTANEOUS | 0 refills | Status: DC
  Filled 2023-08-10: qty 2, 28d supply, fill #0

## 2023-06-15 ENCOUNTER — Other Ambulatory Visit (HOSPITAL_BASED_OUTPATIENT_CLINIC_OR_DEPARTMENT_OTHER): Payer: Self-pay

## 2023-06-18 ENCOUNTER — Encounter (HOSPITAL_BASED_OUTPATIENT_CLINIC_OR_DEPARTMENT_OTHER): Payer: Self-pay

## 2023-06-18 ENCOUNTER — Other Ambulatory Visit: Payer: Self-pay

## 2023-06-18 ENCOUNTER — Other Ambulatory Visit (HOSPITAL_BASED_OUTPATIENT_CLINIC_OR_DEPARTMENT_OTHER): Payer: Self-pay

## 2023-06-20 ENCOUNTER — Other Ambulatory Visit (HOSPITAL_BASED_OUTPATIENT_CLINIC_OR_DEPARTMENT_OTHER): Payer: Self-pay

## 2023-06-23 ENCOUNTER — Other Ambulatory Visit (HOSPITAL_BASED_OUTPATIENT_CLINIC_OR_DEPARTMENT_OTHER): Payer: Self-pay

## 2023-07-06 ENCOUNTER — Other Ambulatory Visit (HOSPITAL_BASED_OUTPATIENT_CLINIC_OR_DEPARTMENT_OTHER): Payer: Self-pay

## 2023-07-06 ENCOUNTER — Other Ambulatory Visit: Payer: Self-pay

## 2023-07-15 ENCOUNTER — Other Ambulatory Visit: Payer: Self-pay

## 2023-07-15 MED ORDER — BUPROPION HCL ER (XL) 300 MG PO TB24: 300.0000 mg | ORAL_TABLET | Freq: Every day | ORAL | 3 refills | Status: DC

## 2023-07-17 ENCOUNTER — Other Ambulatory Visit: Payer: Self-pay | Admitting: Physician Assistant

## 2023-07-17 DIAGNOSIS — K2100 Gastro-esophageal reflux disease with esophagitis, without bleeding: Secondary | ICD-10-CM

## 2023-08-07 ENCOUNTER — Other Ambulatory Visit (HOSPITAL_BASED_OUTPATIENT_CLINIC_OR_DEPARTMENT_OTHER): Payer: Self-pay

## 2023-08-07 ENCOUNTER — Other Ambulatory Visit: Payer: Self-pay | Admitting: Nurse Practitioner

## 2023-08-10 ENCOUNTER — Other Ambulatory Visit (HOSPITAL_BASED_OUTPATIENT_CLINIC_OR_DEPARTMENT_OTHER): Payer: Self-pay

## 2023-08-26 ENCOUNTER — Other Ambulatory Visit (HOSPITAL_BASED_OUTPATIENT_CLINIC_OR_DEPARTMENT_OTHER): Payer: Self-pay

## 2023-08-28 ENCOUNTER — Other Ambulatory Visit: Payer: Self-pay

## 2023-08-28 MED ORDER — ONDANSETRON HCL 4 MG PO TABS: 4.0000 mg | ORAL_TABLET | Freq: Three times a day (TID) | ORAL | 0 refills | Status: AC | PRN

## 2023-09-03 ENCOUNTER — Encounter (HOSPITAL_BASED_OUTPATIENT_CLINIC_OR_DEPARTMENT_OTHER): Payer: Self-pay

## 2023-09-03 ENCOUNTER — Other Ambulatory Visit: Payer: Self-pay | Admitting: Nurse Practitioner

## 2023-09-03 ENCOUNTER — Other Ambulatory Visit (HOSPITAL_BASED_OUTPATIENT_CLINIC_OR_DEPARTMENT_OTHER): Payer: Self-pay

## 2023-09-03 DIAGNOSIS — E663 Overweight: Secondary | ICD-10-CM

## 2023-09-03 MED ORDER — ZEPBOUND 15 MG/0.5ML ~~LOC~~ SOAJ
SUBCUTANEOUS | 0 refills | Status: DC
Start: 2023-09-03 — End: 2023-09-09
  Filled 2023-09-03: qty 2, 28d supply, fill #0

## 2023-09-09 ENCOUNTER — Other Ambulatory Visit (HOSPITAL_BASED_OUTPATIENT_CLINIC_OR_DEPARTMENT_OTHER): Payer: Self-pay

## 2023-09-09 ENCOUNTER — Other Ambulatory Visit: Payer: Self-pay | Admitting: Nurse Practitioner

## 2023-09-09 ENCOUNTER — Other Ambulatory Visit: Payer: Self-pay

## 2023-09-09 DIAGNOSIS — E663 Overweight: Secondary | ICD-10-CM

## 2023-09-09 MED ORDER — FLUOXETINE HCL 40 MG PO CAPS: 40.0000 mg | ORAL_CAPSULE | Freq: Every day | ORAL | 3 refills | Status: DC

## 2023-09-09 MED ORDER — WEGOVY 2.4 MG/0.75ML ~~LOC~~ SOAJ: 2.4000 mg | SUBCUTANEOUS | 3 refills | Status: AC

## 2023-09-11 ENCOUNTER — Other Ambulatory Visit (HOSPITAL_BASED_OUTPATIENT_CLINIC_OR_DEPARTMENT_OTHER): Payer: Self-pay

## 2023-09-13 ENCOUNTER — Other Ambulatory Visit: Payer: Self-pay | Admitting: Nurse Practitioner

## 2023-09-13 DIAGNOSIS — H6692 Otitis media, unspecified, left ear: Secondary | ICD-10-CM

## 2023-09-13 MED ORDER — AMOXICILLIN 500 MG PO TABS
500.0000 mg | ORAL_TABLET | Freq: Three times a day (TID) | ORAL | 0 refills | Status: DC
Start: 2023-09-13 — End: 2023-09-27

## 2023-09-23 ENCOUNTER — Other Ambulatory Visit: Payer: Self-pay

## 2023-09-23 MED ORDER — VERAPAMIL HCL ER 240 MG PO TBCR: 240.0000 mg | EXTENDED_RELEASE_TABLET | Freq: Every day | ORAL | 11 refills | Status: AC

## 2023-09-27 ENCOUNTER — Other Ambulatory Visit: Payer: Self-pay | Admitting: Nurse Practitioner

## 2023-09-27 MED ORDER — CIPROFLOXACIN HCL 500 MG PO TABS: 500.0000 mg | ORAL_TABLET | Freq: Two times a day (BID) | ORAL | 1 refills | Status: AC

## 2023-10-11 ENCOUNTER — Other Ambulatory Visit: Payer: Self-pay | Admitting: Nurse Practitioner

## 2023-10-11 DIAGNOSIS — L03012 Cellulitis of left finger: Secondary | ICD-10-CM

## 2023-10-11 MED ORDER — CEPHALEXIN 500 MG PO CAPS: 500.0000 mg | ORAL_CAPSULE | Freq: Two times a day (BID) | ORAL | 0 refills | Status: AC

## 2023-10-11 NOTE — Progress Notes (Signed)
Cellulitis of index finger of left hand. Rx for Keflex 500 mg BID x 10 sent to pharmacy

## 2023-11-18 ENCOUNTER — Other Ambulatory Visit (HOSPITAL_COMMUNITY): Payer: Self-pay

## 2023-11-18 ENCOUNTER — Telehealth: Payer: Self-pay

## 2023-11-18 NOTE — Telephone Encounter (Signed)
Pharmacy Patient Advocate Encounter   Received notification from [employer] that prior authorization for Pantoprazole 40 mg tabs is required/requested.   Insurance verification completed.   The patient is insured through  [employer] [place]  .   Per test claim: The current 30 day co-pay is, $0.  No PA needed at this time. This test claim was processed through [facility]- copay amounts may vary at other pharmacies due to pharmacy/plan contracts, or as the patient moves through the different stages of their insurance plan.    Prior Berkley Harvey is approved from 11/22/2022-11/22/2024

## 2023-11-28 ENCOUNTER — Other Ambulatory Visit: Payer: Self-pay | Admitting: Internal Medicine

## 2023-11-28 MED ORDER — IPRATROPIUM BROMIDE 0.06 % NA SOLN: NASAL | 3 refills | Status: AC

## 2023-12-02 ENCOUNTER — Telehealth: Payer: Self-pay

## 2023-12-02 NOTE — Telephone Encounter (Signed)
 Prior auth approved for [employer] 2.4 mg through 05/31/24.

## 2023-12-02 NOTE — Telephone Encounter (Signed)
 Prior auth for [facility] 2.4mg  completed and submitted.

## 2024-01-02 ENCOUNTER — Other Ambulatory Visit: Payer: Self-pay

## 2024-01-02 MED ORDER — FLUOXETINE HCL 40 MG PO CAPS: 40.0000 mg | ORAL_CAPSULE | Freq: Every day | ORAL | 3 refills | Status: AC

## 2024-01-11 ENCOUNTER — Other Ambulatory Visit: Payer: Self-pay | Admitting: Internal Medicine

## 2024-01-11 DIAGNOSIS — Z0000 Encounter for general adult medical examination without abnormal findings: Secondary | ICD-10-CM

## 2024-01-13 ENCOUNTER — Other Ambulatory Visit: Payer: Self-pay | Admitting: Obstetrics and Gynecology

## 2024-01-13 ENCOUNTER — Ambulatory Visit
Admission: RE | Admit: 2024-01-13 | Discharge: 2024-01-13 | Disposition: A | Discharge status: Home / Self Care | Payer: 59 | Source: Ambulatory Visit | Attending: Obstetrics and Gynecology | Admitting: Obstetrics and Gynecology

## 2024-01-13 DIAGNOSIS — Z0000 Encounter for general adult medical examination without abnormal findings: Secondary | ICD-10-CM

## 2024-01-17 ENCOUNTER — Encounter: Payer: 59 | Admitting: Nurse Practitioner

## 2024-04-27 ENCOUNTER — Other Ambulatory Visit: Payer: Self-pay | Admitting: Family

## 2024-07-01 ENCOUNTER — Other Ambulatory Visit: Payer: Self-pay | Admitting: Physician Assistant

## 2024-07-01 DIAGNOSIS — K2100 Gastro-esophageal reflux disease with esophagitis, without bleeding: Secondary | ICD-10-CM

## 2024-08-31 ENCOUNTER — Other Ambulatory Visit: Payer: Self-pay | Admitting: Nurse Practitioner
